# Patient Record
Sex: Female | Born: 1950
Health system: Southern US, Community
[De-identification: ages and names within clinical notes are randomized; demographics above are authoritative.]

## PROBLEM LIST (undated history)

## (undated) DIAGNOSIS — IMO0002 Reserved for concepts with insufficient information to code with codable children: Secondary | ICD-10-CM

## (undated) DIAGNOSIS — R011 Cardiac murmur, unspecified: Secondary | ICD-10-CM

## (undated) DIAGNOSIS — I10 Essential (primary) hypertension: Secondary | ICD-10-CM

## (undated) DIAGNOSIS — Z9889 Other specified postprocedural states: Secondary | ICD-10-CM

## (undated) HISTORY — PX: CHOLECYSTECTOMY: SHX55

## (undated) HISTORY — PX: APPENDECTOMY: SHX54

## (undated) HISTORY — PX: ABDOMINAL HYSTERECTOMY: SHX81

## (undated) HISTORY — PX: TONSILLECTOMY: SUR1361

---

## 2004-06-12 ENCOUNTER — Emergency Department: Payer: Self-pay | Admitting: Emergency Medicine

## 2004-10-05 ENCOUNTER — Ambulatory Visit: Payer: Self-pay

## 2004-12-07 ENCOUNTER — Other Ambulatory Visit: Payer: Self-pay

## 2004-12-07 ENCOUNTER — Emergency Department: Payer: Self-pay | Admitting: Emergency Medicine

## 2007-07-02 ENCOUNTER — Emergency Department: Payer: Self-pay | Admitting: Emergency Medicine

## 2008-02-13 ENCOUNTER — Emergency Department: Payer: Self-pay | Admitting: Emergency Medicine

## 2010-02-18 ENCOUNTER — Emergency Department: Payer: Self-pay | Admitting: Emergency Medicine

## 2010-05-22 ENCOUNTER — Emergency Department (HOSPITAL_COMMUNITY): Admission: EM | Admit: 2010-05-22 | Discharge: 2010-05-22 | Payer: Self-pay | Admitting: Emergency Medicine

## 2010-09-20 LAB — COMPREHENSIVE METABOLIC PANEL
AST: 18 U/L (ref 0–37)
Albumin: 3.7 g/dL (ref 3.5–5.2)
BUN: 21 mg/dL (ref 6–23)
Chloride: 110 mEq/L (ref 96–112)
Creatinine, Ser: 1.01 mg/dL (ref 0.4–1.2)
GFR calc Af Amer: >60 mL/min (ref >60)
Total Protein: 7.2 g/dL (ref 6.0–8.3)

## 2010-09-20 LAB — URINALYSIS, ROUTINE W REFLEX MICROSCOPIC
Ketones, ur: NEGATIVE mg/dL
Protein, ur: NEGATIVE mg/dL
Urobilinogen, UA: 0.2 mg/dL (ref 0.0–1.0)

## 2010-09-20 LAB — CBC
MCH: 28.8 pg (ref 26.0–34.0)
MCV: 88.1 fL (ref 78.0–100.0)
Platelets: 242 10*3/uL (ref 150–400)
RBC: 4.2 MIL/uL (ref 3.87–5.11)
RDW: 12.9 % (ref 11.5–15.5)
WBC: 7 10*3/uL (ref 4.0–10.5)

## 2010-09-20 LAB — URINE MICROSCOPIC-ADD ON

## 2010-09-20 LAB — DIFFERENTIAL
Eosinophils Relative: 2 % (ref 0–5)
Lymphocytes Relative: 40 % (ref 12–46)
Lymphs Abs: 2.8 10*3/uL (ref 0.7–4.0)
Monocytes Absolute: 0.3 10*3/uL (ref 0.1–1.0)
Monocytes Relative: 4 % (ref 3–12)
Neutro Abs: 3.8 10*3/uL (ref 1.7–7.7)

## 2010-09-20 LAB — WET PREP, GENITAL
Trich, Wet Prep: NONE SEEN
Yeast Wet Prep HPF POC: NONE SEEN

## 2010-09-20 LAB — GC/CHLAMYDIA PROBE AMP, GENITAL: GC Probe Amp, Genital: NEGATIVE

## 2011-04-26 ENCOUNTER — Ambulatory Visit: Payer: Self-pay | Admitting: Internal Medicine

## 2011-06-25 ENCOUNTER — Emergency Department: Payer: Self-pay | Admitting: Emergency Medicine

## 2012-01-11 ENCOUNTER — Emergency Department: Payer: Self-pay | Admitting: *Deleted

## 2012-09-05 ENCOUNTER — Emergency Department: Payer: Self-pay | Admitting: Emergency Medicine

## 2012-09-05 LAB — CBC
HCT: 37.3 % (ref 35.0–47.0)
HGB: 12 g/dL (ref 12.0–16.0)
MCH: 28 pg (ref 26.0–34.0)
MCHC: 32.2 g/dL (ref 32.0–36.0)
RDW: 13.5 % (ref 11.5–14.5)

## 2012-09-05 LAB — COMPREHENSIVE METABOLIC PANEL
Albumin: 3.7 g/dL (ref 3.4–5.0)
Anion Gap: 6 — ABNORMAL LOW (ref 7–16)
Bilirubin,Total: 0.2 mg/dL (ref 0.2–1.0)
Co2: 28 mmol/L (ref 21–32)
Osmolality: 279 (ref 275–301)
Potassium: 3.3 mmol/L — ABNORMAL LOW (ref 3.5–5.1)

## 2012-09-05 LAB — URINALYSIS, COMPLETE
Ph: 6 (ref 4.5–8.0)
Protein: NEGATIVE
RBC,UR: 1 /HPF (ref 0–5)
Specific Gravity: 1.008 (ref 1.003–1.030)
Squamous Epithelial: 2

## 2012-09-05 LAB — LIPASE, BLOOD: Lipase: 208 U/L (ref 73–393)

## 2013-09-02 ENCOUNTER — Emergency Department (HOSPITAL_COMMUNITY)
Admission: EM | Admit: 2013-09-02 | Discharge: 2013-09-02 | Disposition: A | Discharge status: Home / Self Care | Payer: BC Managed Care – PPO | Attending: Emergency Medicine | Admitting: Emergency Medicine

## 2013-09-02 ENCOUNTER — Encounter (HOSPITAL_COMMUNITY): Payer: Self-pay | Admitting: Emergency Medicine

## 2013-09-02 DIAGNOSIS — R599 Enlarged lymph nodes, unspecified: Secondary | ICD-10-CM | POA: Insufficient documentation

## 2013-09-02 DIAGNOSIS — R42 Dizziness and giddiness: Secondary | ICD-10-CM | POA: Insufficient documentation

## 2013-09-02 DIAGNOSIS — J329 Chronic sinusitis, unspecified: Secondary | ICD-10-CM | POA: Insufficient documentation

## 2013-09-02 MED ORDER — PSEUDOEPHEDRINE HCL 30 MG PO TABS: 30.0000 mg | ORAL_TABLET | Freq: Four times a day (QID) | ORAL | Status: DC | PRN

## 2013-09-02 MED ORDER — AMOXICILLIN 500 MG PO CAPS: 500.0000 mg | ORAL_CAPSULE | Freq: Three times a day (TID) | ORAL | Status: DC

## 2013-09-02 NOTE — ED Notes (Signed)
Pt states she has nasal congestion x 2 days, c/o headache at times. Pt states she tries mucinex, tylenol sinus w/o relief.

## 2013-09-02 NOTE — Discharge Instructions (Signed)
Sinus Headache °A sinus headache happens when your sinuses become clogged or puffy (swollen). Sinus headaches can be mild or severe. °HOME CARE °· Take your medicines (antibiotics) as told. Finish them even if you start to feel better. °· Only take medicine as told by your doctor. °· Use a nose spray if you feel stuffed up (congested). °GET HELP RIGHT AWAY IF: °· You have a fever. °· You have trouble seeing. °· You suddenly have pain in your face or head. °· You start to twitch or shake (seizure). °· You are confused. °· You get headaches more than once a week. °· Light or sound bothers you. °· You feel sick to your stomach (nauseous) or throw up (vomit). °· Your headaches do not get better with treatment. °MAKE SURE YOU: °· Understand these instructions. °· Will watch your condition. °· Will get help right away if you are not doing well or get worse. °Document Released: 10/26/2010 Document Revised: 09/18/2011 Document Reviewed: 10/26/2010 °ExitCare® Patient Information ©2014 [employer]. ° °Sinusitis °Sinusitis is redness, soreness, and swelling (inflammation) of the paranasal sinuses. Paranasal sinuses are air pockets within the bones of your face (beneath the eyes, the middle of the forehead, or above the eyes). In healthy paranasal sinuses, mucus is able to drain out, and air is able to circulate through them by way of your nose. However, when your paranasal sinuses are inflamed, mucus and air can become trapped. This can allow bacteria and other germs to grow and cause infection. °Sinusitis can develop quickly and last only a short time (acute) or continue over a long period (chronic). Sinusitis that lasts for more than 12 weeks is considered chronic.  °CAUSES  °Causes of sinusitis include: °· Allergies. °· Structural abnormalities, such as displacement of the cartilage that separates your nostrils (deviated septum), which can decrease the air flow through your nose and sinuses and affect sinus  drainage. °· Functional abnormalities, such as when the small hairs (cilia) that line your sinuses and help remove mucus do not work properly or are not present. °SYMPTOMS  °Symptoms of acute and chronic sinusitis are the same. The primary symptoms are pain and pressure around the affected sinuses. Other symptoms include: °· Upper toothache. °· Earache. °· Headache. °· Bad breath. °· Decreased sense of smell and taste. °· A cough, which worsens when you are lying flat. °· Fatigue. °· Fever. °· Thick drainage from your nose, which often is green and may contain pus (purulent). °· Swelling and warmth over the affected sinuses. °DIAGNOSIS  °Your caregiver will perform a physical exam. During the exam, your caregiver may: °· Look in your nose for signs of abnormal growths in your nostrils (nasal polyps). °· Tap over the affected sinus to check for signs of infection. °· View the inside of your sinuses (endoscopy) with a special imaging device with a light attached (endoscope), which is inserted into your sinuses. °If your caregiver suspects that you have chronic sinusitis, one or more of the following tests may be recommended: °· Allergy tests. °· Nasal culture A sample of mucus is taken from your nose and sent to a lab and screened for bacteria. °· Nasal cytology A sample of mucus is taken from your nose and examined by your caregiver to determine if your sinusitis is related to an allergy. °TREATMENT  °Most cases of acute sinusitis are related to a viral infection and will resolve on their own within 10 days. Sometimes medicines are prescribed to help relieve symptoms (pain medicine,   decongestants, nasal steroid sprays, or saline sprays).  °However, for sinusitis related to a bacterial infection, your caregiver will prescribe antibiotic medicines. These are medicines that will help kill the bacteria causing the infection.  °Rarely, sinusitis is caused by a fungal infection. In theses cases, your caregiver will  prescribe antifungal medicine. °For some cases of chronic sinusitis, surgery is needed. Generally, these are cases in which sinusitis recurs more than 3 times per year, despite other treatments. °HOME CARE INSTRUCTIONS  °· Drink plenty of water. Water helps thin the mucus so your sinuses can drain more easily. °· Use a humidifier. °· Inhale steam 3 to 4 times a day (for example, sit in the bathroom with the shower running). °· Apply a warm, moist washcloth to your face 3 to 4 times a day, or as directed by your caregiver. °· Use saline nasal sprays to help moisten and clean your sinuses. °· Take over-the-counter or prescription medicines for pain, discomfort, or fever only as directed by your caregiver. °SEEK IMMEDIATE MEDICAL CARE IF: °· You have increasing pain or severe headaches. °· You have nausea, vomiting, or drowsiness. °· You have swelling around your face. °· You have vision problems. °· You have a stiff neck. °· You have difficulty breathing. °MAKE SURE YOU:  °· Understand these instructions. °· Will watch your condition. °· Will get help right away if you are not doing well or get worse. °Document Released: 06/26/2005 Document Revised: 09/18/2011 Document Reviewed: 07/11/2011 °ExitCare® Patient Information ©2014 [employer]. ° °

## 2013-09-02 NOTE — ED Provider Notes (Signed)
CSN: [id number]     Arrival date & time 09/02/13  1430 History  This chart was scribed for non-physician practitioner, Roxy Horsemanobert Argelia Formisano, PA-C working with Audree CamelScott T Goldston, MD by Greggory StallionKayla Andersen, ED scribe. This patient was seen in room WTR8/WTR8 and the patient's care was started at 3:48 PM.    Chief Complaint  Patient presents with  . Facial Pain  . Nasal Congestion   The history is provided by the patient. No language interpreter was used.   HPI Comments: Lori Holland is a 63 y.o. female who presents to the Emergency Department complaining of sudden onset maxillary sinus pain, pressure and nasal congestion that started 2 days ago. She states she has also had a cough, headache and intermittent light headedness. Pt has tried mucinex, alka seltzer plus and tylenol sinus with no relief. Denies fever.   History reviewed. No pertinent past medical history. Past Surgical History  Procedure Laterality Date  . Abdominal hysterectomy    . Tonsillectomy     No family history on file. History  Substance Use Topics  . Smoking status: Never Smoker   . Smokeless tobacco: Not on file  . Alcohol Use: No   OB History   Grav Para Term Preterm Abortions TAB SAB Ect Mult Living                 Review of Systems  Constitutional: Negative for fever.  HENT: Positive for congestion and sinus pressure.   Eyes: Negative for redness.  Respiratory: Positive for cough. Negative for shortness of breath.   Gastrointestinal: Negative for abdominal pain.  Musculoskeletal: Negative for gait problem.  Skin: Negative for rash.  Neurological: Positive for light-headedness and headaches. Negative for speech difficulty.  Psychiatric/Behavioral: Negative for confusion.   Allergies  Sulfa antibiotics  Home Medications   Current Outpatient Rx  Name  Route  Sig  Dispense  Refill  . aspirin-sod bicarb-citric acid (ALKA-SELTZER) 325 MG TBEF tablet   Oral   Take 325 mg by mouth every 6 (six) hours as needed.         . Diphenhydramine-PSE-APAP (TYLENOL ALLERGY SINUS NIGHT PO)   Oral   Take 1 capsule by mouth at bedtime as needed (cold symtoms).         Marland Kitchen. guaiFENesin (MUCINEX) 600 MG 12 hr tablet   Oral   Take 600 mg by mouth 2 (two) times daily as needed (cough).          BP 147/68  Pulse 64  Temp(Src) 97.9 F (36.6 C) (Oral)  Resp 20  SpO2 99%  Physical Exam  Nursing note and vitals reviewed. Constitutional: She is oriented to person, place, and time. She appears well-developed and well-nourished. No distress.  HENT:  Head: Normocephalic and atraumatic.  Nose: Right sinus exhibits maxillary sinus tenderness and frontal sinus tenderness. Left sinus exhibits maxillary sinus tenderness and frontal sinus tenderness.  Congestion behind TMs.   Eyes: Conjunctivae and EOM are normal.  Cardiovascular: Normal rate, regular rhythm and normal heart sounds.  Exam reveals no gallop and no friction rub.   No murmur heard. Pulmonary/Chest: Effort normal and breath sounds normal. No stridor. No respiratory distress. She has no wheezes. She has no rales.  Abdominal: She exhibits no distension.  Musculoskeletal: She exhibits no edema.  Lymphadenopathy:    She has cervical adenopathy.  Neurological: She is alert and oriented to person, place, and time. No cranial nerve deficit.  Skin: Skin is warm and dry.  Psychiatric: She has a  normal mood and affect.    ED Course  Procedures (including critical care time)  DIAGNOSTIC STUDIES: Oxygen Saturation is 99% on RA, normal by my interpretation.    COORDINATION OF CARE: 3:52 PM-Discussed treatment plan which includes amoxicillin and sudafed with pt at bedside and pt agreed to plan. Return precautions given.   Labs Review Labs Reviewed - No data to display Imaging Review No results found.  EKG Interpretation   None       MDM   Final diagnoses:  Sinusitis      I personally performed the services described in this documentation,  which was scribed in my presence. The recorded information has been reviewed and is accurate.    Roxy Horseman, PA-C 09/02/13 1605

## 2013-09-03 NOTE — ED Provider Notes (Signed)
Medical screening examination/treatment/procedure(s) were performed by non-physician practitioner and as supervising physician I was immediately available for consultation/collaboration.  EKG Interpretation   None         Madoline Bhatt T Marquite Attwood, MD 09/03/13 0001 

## 2013-09-07 ENCOUNTER — Emergency Department (HOSPITAL_COMMUNITY)
Admission: EM | Admit: 2013-09-07 | Discharge: 2013-09-07 | Disposition: A | Discharge status: Home / Self Care | Payer: BC Managed Care – PPO | Source: Home / Self Care

## 2013-09-07 ENCOUNTER — Encounter (HOSPITAL_COMMUNITY): Payer: Self-pay | Admitting: Emergency Medicine

## 2013-09-07 DIAGNOSIS — B373 Candidiasis of vulva and vagina: Secondary | ICD-10-CM

## 2013-09-07 DIAGNOSIS — B3731 Acute candidiasis of vulva and vagina: Secondary | ICD-10-CM

## 2013-09-07 LAB — POCT URINALYSIS DIP (DEVICE)
BILIRUBIN URINE: NEGATIVE
GLUCOSE, UA: NEGATIVE mg/dL
Ketones, ur: NEGATIVE mg/dL
NITRITE: NEGATIVE
Protein, ur: NEGATIVE mg/dL
Specific Gravity, Urine: >=1.03 (ref 1.005–1.030)
Urobilinogen, UA: 0.2 mg/dL (ref 0.0–1.0)
pH: 5 (ref 5.0–8.0)

## 2013-09-07 MED ORDER — ALIGN 4 MG PO CAPS: 1.0000 | ORAL_CAPSULE | Freq: Every day | ORAL | Status: DC

## 2013-09-07 MED ORDER — FLUCONAZOLE 200 MG PO TABS: 200.0000 mg | ORAL_TABLET | Freq: Every day | ORAL | Status: AC

## 2013-09-07 NOTE — ED Provider Notes (Signed)
CSN: [id number]     Arrival date & time 09/07/13  1702 History   None    Chief Complaint  Patient presents with  . Vaginitis   (Consider location/radiation/quality/duration/timing/severity/associated sxs/prior Treatment)  HPI  The patient is a 63 year old female presenting tonight with reports of vaginal itching which started yesterday. Patient states she started taking amoxicillin for a recent sinus infections past Tuesday. The patient is sexually active in a monogamous relationship.  The patient denies any other symptoms or complaints but is concerned about a vaginal yeast infection and wanted to "get ahead of it". Denies history of frequent yeast infections or diabetes.   History reviewed. No pertinent past medical history. Past Surgical History  Procedure Laterality Date  . Abdominal hysterectomy    . Tonsillectomy     No family history on file. History  Substance Use Topics  . Smoking status: Never Smoker   . Smokeless tobacco: Not on file  . Alcohol Use: No   OB History   Grav Para Term Preterm Abortions TAB SAB Ect Mult Living                 Review of Systems  Constitutional: Negative.   HENT: Negative.   Eyes: Negative.   Respiratory: Negative.   Cardiovascular: Negative.   Gastrointestinal: Negative.   Endocrine: Negative.   Genitourinary: Negative for dysuria, urgency, frequency, hematuria, flank pain, decreased urine volume, vaginal bleeding, vaginal discharge, difficulty urinating, genital sores, pelvic pain and dyspareunia.       Vaginal itching as the patient's only genital complaint.    Musculoskeletal: Negative.   Allergic/Immunologic: Negative.   Neurological: Negative.   Hematological: Negative.   Psychiatric/Behavioral: Negative.     Allergies  Sulfa antibiotics  Home Medications   Current Outpatient Rx  Name  Route  Sig  Dispense  Refill  . amoxicillin (AMOXIL) 500 MG capsule   Oral   Take 1 capsule (500 mg total) by mouth 3 (three) times  daily.   30 capsule   0   . aspirin-sod bicarb-citric acid (ALKA-SELTZER) 325 MG TBEF tablet   Oral   Take 325 mg by mouth every 6 (six) hours as needed.         . Diphenhydramine-PSE-APAP (TYLENOL ALLERGY SINUS NIGHT PO)   Oral   Take 1 capsule by mouth at bedtime as needed (cold symtoms).         . fluconazole (DIFLUCAN) 200 MG tablet   Oral   Take 1 tablet (200 mg total) by mouth daily.   3 tablet   0   . guaiFENesin (MUCINEX) 600 MG 12 hr tablet   Oral   Take 600 mg by mouth 2 (two) times daily as needed (cough).         . Probiotic Product (ALIGN) 4 MG CAPS   Oral   Take 1 capsule (4 mg total) by mouth daily.   30 capsule   2   . pseudoephedrine (NASAL DECONGESTANT) 30 MG tablet   Oral   Take 1 tablet (30 mg total) by mouth every 6 (six) hours as needed for congestion.   30 tablet   0    BP 143/66  Pulse 60  Temp(Src) 97.3 F (36.3 C) (Oral)  Resp 16  SpO2 100%  Physical Exam  Nursing note and vitals reviewed. Constitutional: She appears well-developed and well-nourished. No distress.  Cardiovascular: Normal rate, regular rhythm, normal heart sounds and intact distal pulses.  Exam reveals no gallop and no friction rub.  No murmur heard. Pulmonary/Chest: Effort normal and breath sounds normal. No respiratory distress. She has no wheezes. She has no rales. She exhibits no tenderness.  Abdominal: Soft. Bowel sounds are normal. She exhibits no distension and no mass. There is no tenderness. There is no rebound and no guarding.  Genitourinary: Vaginal discharge found.  Mild erythema noted in the patient's vestibule with thick white discharge present and vaginal opening.  Skin: Skin is warm and dry. She is not diaphoretic.    ED Course  Procedures (including critical care time) Labs Review Labs Reviewed  POCT URINALYSIS DIP (DEVICE) - Abnormal; Notable for the following:    Hgb urine dipstick TRACE (*)    Leukocytes, UA TRACE (*)    All other  components within normal limits   Imaging Review No results found.   MDM   1. Vaginal yeast infection    Meds ordered this encounter  Medications  . Probiotic Product (ALIGN) 4 MG CAPS    Sig: Take 1 capsule (4 mg total) by mouth daily.    Dispense:  30 capsule    Refill:  2  . fluconazole (DIFLUCAN) 200 MG tablet    Sig: Take 1 tablet (200 mg total) by mouth daily.    Dispense:  3 tablet    Refill:  0       Weber Cooksatherine Rossi, NP 09/07/13 1845

## 2013-09-07 NOTE — Discharge Instructions (Signed)
Use of a Probiotic with Lactobacillus in it can help prevent infections when on antibiotic therapy. Candidal Vulvovaginitis Candidal vulvovaginitis is an infection of the vagina and vulva. The vulva is the skin around the opening of the vagina. This may cause itching and discomfort in and around the vagina.  HOME CARE  Only take medicine as told by your doctor.  Do not have sex (intercourse) until the infection is healed or as told by your doctor.  Practice safe sex.  Tell your sex partner about your infection.  Do not douche or use tampons.  Wear cotton underwear. Do not wear tight pants or panty hose.  Eat yogurt. This may help treat and prevent yeast infections. GET HELP RIGHT AWAY IF:   You have a fever.  Your problems get worse during treatment or do not get better in 3 days.  You have discomfort, irritation, or itching in your vagina or vulva area.  You have pain after sex.  You start to get belly (abdominal) pain. MAKE SURE YOU:  Understand these instructions.  Will watch your condition.  Will get help right away if you are not doing well or get worse. Document Released: 09/22/2008 Document Revised: 09/18/2011 Document Reviewed: 09/22/2008 [facility] Patient Information 2014 [place]ExitCare, [place].

## 2013-09-07 NOTE — ED Notes (Signed)
Pt reports she started an antibiotic last week and now has a yeast infection. Patient denies any pain just vaginal itching. Patient is alert and oriented and in no acute distress.

## 2013-09-08 NOTE — ED Provider Notes (Signed)
Medical screening examination/treatment/procedure(s) were performed by a resident physician or non-physician practitioner and as the supervising physician I was immediately available for consultation/collaboration.  Connor Foxworthy, MD    Trinia Georgi S Luisana Lutzke, MD 09/08/13 0748 

## 2014-01-11 ENCOUNTER — Encounter (HOSPITAL_COMMUNITY): Payer: Self-pay | Admitting: Emergency Medicine

## 2014-01-11 ENCOUNTER — Emergency Department (HOSPITAL_COMMUNITY)
Admission: EM | Admit: 2014-01-11 | Discharge: 2014-01-11 | Disposition: A | Discharge status: Home / Self Care | Payer: No Typology Code available for payment source | Attending: Emergency Medicine | Admitting: Emergency Medicine

## 2014-01-11 DIAGNOSIS — M545 Low back pain, unspecified: Secondary | ICD-10-CM | POA: Insufficient documentation

## 2014-01-11 HISTORY — DX: Reserved for concepts with insufficient information to code with codable children: IMO0002

## 2014-01-11 MED ORDER — METHOCARBAMOL 500 MG PO TABS: 500.0000 mg | ORAL_TABLET | Freq: Two times a day (BID) | ORAL | Status: DC

## 2014-01-11 MED ORDER — TRAMADOL HCL 50 MG PO TABS: 50.0000 mg | ORAL_TABLET | Freq: Four times a day (QID) | ORAL | Status: DC | PRN

## 2014-01-11 MED ORDER — KETOROLAC TROMETHAMINE 60 MG/2ML IM SOLN
60.0000 mg | Freq: Once | INTRAMUSCULAR | Status: AC
  Administered 2014-01-11: 60 mg via INTRAMUSCULAR
  Filled 2014-01-11: qty 2

## 2014-01-11 NOTE — ED Provider Notes (Signed)
Medical screening examination/treatment/procedure(s) were performed by non-physician practitioner and as supervising physician I was immediately available for consultation/collaboration.   Jaretzi Droz T Cammy Sanjurjo, MD 01/11/14 0617 

## 2014-01-11 NOTE — ED Notes (Signed)
Pt arrived to the ED with a complaint of lower back pain.  Pt has a hx of bulging discs and has recently started anew job.  Pt believes new job has irritated her back due to the pain coinciding with the new activity level.

## 2014-01-11 NOTE — ED Provider Notes (Signed)
CSN: [id number]     Arrival date & time 01/11/14  0302 History   First MD Initiated Contact with Patient 01/11/14 [phone]     Chief Complaint  Patient presents with  . Back Pain     (Consider location/radiation/quality/duration/timing/severity/associated sxs/prior Treatment) HPI Comments: Patient is a 63 year old female with a history of bulging discs in her low back who presents to the emergency department for low back pain. Patient states that back pain began shortly after starting a new job. She states back pain improved with rest and naproxen last weekend, but worsened again when she went back to work. Patient has had no relief from naproxen over the last 2 days. She states that pain is an aching, throbbing pain that is nonradiating. Patient does endorse frequent bending and twisting of her back while at her job. She denies associated fever, urinary symptoms, abdominal pain, nausea or vomiting, numbness/paresthesias, extremity weakness, inability to walk, and bowel or bladder incontinence.  Patient is a 63 y.o. female presenting with back pain. The history is provided by the patient. No language interpreter was used.  Back Pain   Past Medical History  Diagnosis Date  . Bulging disc    Past Surgical History  Procedure Laterality Date  . Abdominal hysterectomy    . Tonsillectomy     History reviewed. No pertinent family history. History  Substance Use Topics  . Smoking status: Never Smoker   . Smokeless tobacco: Not on file  . Alcohol Use: No   OB History   Grav Para Term Preterm Abortions TAB SAB Ect Mult Living                  Review of Systems  Musculoskeletal: Positive for back pain.  All other systems reviewed and are negative.    Allergies  Sulfa antibiotics  Home Medications   Prior to Admission medications   Medication Sig Start Date End Date Taking? Authorizing Provider  methocarbamol (ROBAXIN) 500 MG tablet Take 1 tablet (500 mg total) by mouth 2 (two)  times daily. 01/11/14   Antony MaduraKelly Kayline Sheer, PA-C  traMADol (ULTRAM) 50 MG tablet Take 1 tablet (50 mg total) by mouth every 6 (six) hours as needed. 01/11/14   Antony MaduraKelly Shakirah Kirkey, PA-C   BP 142/74  Pulse 58  Temp(Src) 98 F (36.7 C) (Oral)  Resp 15  Ht 5\' 7"  (1.702 m)  Wt 137 lb (62.143 kg)  BMI 21.45 kg/m2  SpO2 100% Physical Exam  Nursing note and vitals reviewed. Constitutional: She is oriented to person, place, and time. She appears well-developed and well-nourished. No distress.  Nontoxic/nonseptic appearing  HENT:  Head: Normocephalic and atraumatic.  Eyes: Conjunctivae and EOM are normal. No scleral icterus.  Neck: Normal range of motion.  Cardiovascular: Normal rate, regular rhythm and intact distal pulses.   DP and PT pulses 2+ bilaterally  Pulmonary/Chest: Effort normal. No respiratory distress.  Musculoskeletal: Normal range of motion. She exhibits tenderness.  Tenderness to palpation of lower lumbar paraspinal muscles bilaterally. No bony deformities, step-offs, or crepitus. No evidence of direct injury or trauma.  Neurological: She is alert and oriented to person, place, and time. She exhibits normal muscle tone. Coordination normal.  GCS 15. Patient moves extremities without ataxia. No gross sensory deficits appreciated. Patient ambulates with normal gait.  Skin: Skin is warm and dry. No rash noted. She is not diaphoretic. No erythema. No pallor.  Psychiatric: She has a normal mood and affect. Her behavior is normal.    ED Course  Procedures (including critical care time) Labs Review Labs Reviewed - No data to display  Imaging Review No results found.   EKG Interpretation None      MDM   Final diagnoses:  Low back pain without sciatica, unspecified back pain laterality    Patient with back pain. She endorses a hx of bulging discs and states pain today feels the same. No neurological deficits and normal neuro exam. Patient can ambulate without difficulty. No loss of bowel  or bladder control. No concern for cauda equina. No fever, night sweats, or h/o cancer. No IVDU. RICE protocol and pain medicine indicated and discussed with patient. Pain treated in ED with Toradol as patient drove herself to the emergency department today. Advised primary care followup for symptom management. Return precautions provided and patient agreeable to plan with no unaddressed concerns.   Filed Vitals:   01/11/14 0332  BP: 142/74  Pulse: 58  Temp: 98 F (36.7 C)  TempSrc: Oral  Resp: 15  Height: 5\' 7"  (1.702 m)  Weight: 137 lb (62.143 kg)  SpO2: 100%      Antony MaduraKelly Estrella Alcaraz, PA-C 01/11/14 [phone]

## 2014-01-11 NOTE — Discharge Instructions (Signed)
Recommend you refrain from frequent bending and twisting as well as strenuous activity or heavy lifting. Continue taking naproxen as prescribed for back pain. You may take tramadol for severe pain and Robaxin for muscle spasms. Followup with your primary care doctor to ensure symptoms resolve. Return as needed if symptoms worsen.   Back Pain, Adult Low back pain is very common. About 1 in 5 people have back pain.The cause of low back pain is rarely dangerous. The pain often gets better over time.About half of people with a sudden onset of back pain feel better in just 2 weeks. About 8 in 10 people feel better by 6 weeks.  CAUSES Some common causes of back pain include:  Strain of the muscles or ligaments supporting the spine.  Wear and tear (degeneration) of the spinal discs.  Arthritis.  Direct injury to the back. DIAGNOSIS Most of the time, the direct cause of low back pain is not known.However, back pain can be treated effectively even when the exact cause of the pain is unknown.Answering your caregiver's questions about your overall health and symptoms is one of the most accurate ways to make sure the cause of your pain is not dangerous. If your caregiver needs more information, he or she may order lab work or imaging tests (X-rays or MRIs).However, even if imaging tests show changes in your back, this usually does not require surgery. HOME CARE INSTRUCTIONS For many people, back pain returns.Since low back pain is rarely dangerous, it is often a condition that people can learn to [facility]ageon their own.   Remain active. It is stressful on the back to sit or stand in one place. Do not sit, drive, or stand in one place for more than 30 minutes at a time. Take short walks on level surfaces as soon as pain allows.Try to increase the length of time you walk each day.  Do not stay in bed.Resting more than 1 or 2 days can delay your recovery.  Do not avoid exercise or work.Your body is  made to move.It is not dangerous to be active, even though your back may hurt.Your back will likely heal faster if you return to being active before your pain is gone.  Pay attention to your body when you bend and lift. Many people have less discomfortwhen lifting if they bend their knees, keep the load close to their bodies,and avoid twisting. Often, the most comfortable positions are those that put less stress on your recovering back.  Find a comfortable position to sleep. Use a firm mattress and lie on your side with your knees slightly bent. If you lie on your back, put a pillow under your knees.  Only take over-the-counter or prescription medicines as directed by your caregiver. Over-the-counter medicines to reduce pain and inflammation are often the most helpful.Your caregiver may prescribe muscle relaxant drugs.These medicines help dull your pain so you can more quickly return to your normal activities and healthy exercise.  Put ice on the injured area.  Put ice in a plastic bag.  Place a towel between your skin and the bag.  Leave the ice on for 15-20 minutes, 03-04 times a day for the first 2 to 3 days. After that, ice and heat may be alternated to reduce pain and spasms.  Ask your caregiver about trying back exercises and gentle massage. This may be of some benefit.  Avoid feeling anxious or stressed.Stress increases muscle tension and can worsen back pain.It is important to recognize when you  are anxious or stressed and learn ways to manage it.Exercise is a great option. SEEK MEDICAL CARE IF:  You have pain that is not relieved with rest or medicine.  You have pain that does not improve in 1 week.  You have new symptoms.  You are generally not feeling well. SEEK IMMEDIATE MEDICAL CARE IF:   You have pain that radiates from your back into your legs.  You develop new bowel or bladder control problems.  You have unusual weakness or numbness in your arms or  legs.  You develop nausea or vomiting.  You develop abdominal pain.  You feel faint. Document Released: 06/26/2005 Document Revised: 12/26/2011 Document Reviewed: 11/14/2010 [facility] Patient Information 2015 [place], [place]. This information is not intended to replace advice given to you by your health care provider. Make sure you discuss any questions you have with your health care provider.

## 2014-06-06 ENCOUNTER — Emergency Department: Payer: Self-pay | Admitting: Student

## 2014-07-28 ENCOUNTER — Emergency Department: Payer: Self-pay | Admitting: Emergency Medicine

## 2014-07-28 LAB — CBC WITH DIFFERENTIAL/PLATELET
BASOS ABS: 0.1 10*3/uL (ref 0.0–0.1)
Basophil %: 0.9 %
EOS ABS: 0.2 10*3/uL (ref 0.0–0.7)
Eosinophil %: 3.2 %
HCT: 39.4 % (ref 35.0–47.0)
HGB: 12.7 g/dL (ref 12.0–16.0)
LYMPHS ABS: 2.1 10*3/uL (ref 1.0–3.6)
LYMPHS PCT: 30.1 %
MCH: 28.5 pg (ref 26.0–34.0)
MCHC: 32.2 g/dL (ref 32.0–36.0)
MCV: 89 fL (ref 80–100)
MONO ABS: 0.4 x10 3/mm (ref 0.2–0.9)
Monocyte %: 6.1 %
NEUTROS ABS: 4.2 10*3/uL (ref 1.4–6.5)
Neutrophil %: 59.7 %
Platelet: 216 10*3/uL (ref 150–440)
RBC: 4.45 10*6/uL (ref 3.80–5.20)
RDW: 14 % (ref 11.5–14.5)
WBC: 7 10*3/uL (ref 3.6–11.0)

## 2014-07-28 LAB — COMPREHENSIVE METABOLIC PANEL
ALK PHOS: 78 U/L
ANION GAP: 7 (ref 7–16)
AST: 30 U/L (ref 15–37)
Albumin: 3.8 g/dL (ref 3.4–5.0)
BUN: 11 mg/dL (ref 7–18)
Bilirubin,Total: 0.2 mg/dL (ref 0.2–1.0)
CALCIUM: 9.2 mg/dL (ref 8.5–10.1)
CO2: 27 mmol/L (ref 21–32)
Chloride: 109 mmol/L — ABNORMAL HIGH (ref 98–107)
Creatinine: 0.98 mg/dL (ref 0.60–1.30)
EGFR (African American): >60
Glucose: 89 mg/dL (ref 65–99)
OSMOLALITY: 284 (ref 275–301)
POTASSIUM: 3.8 mmol/L (ref 3.5–5.1)
SGPT (ALT): 25 U/L
SODIUM: 143 mmol/L (ref 136–145)
Total Protein: 8 g/dL (ref 6.4–8.2)

## 2014-07-28 LAB — URINALYSIS, COMPLETE
BILIRUBIN, UR: NEGATIVE
BLOOD: NEGATIVE
Glucose,UR: NEGATIVE mg/dL (ref 0–75)
KETONE: NEGATIVE
Nitrite: NEGATIVE
PH: 5 (ref 4.5–8.0)
Protein: NEGATIVE
RBC, UR: NONE SEEN /HPF (ref 0–5)
Specific Gravity: 1.018 (ref 1.003–1.030)
WBC UR: 4 /HPF (ref 0–5)

## 2014-07-28 LAB — LIPASE, BLOOD: Lipase: 223 U/L (ref 73–393)

## 2014-07-30 LAB — URINE CULTURE

## 2014-08-05 ENCOUNTER — Ambulatory Visit: Payer: Self-pay | Admitting: Surgery

## 2014-08-06 ENCOUNTER — Ambulatory Visit: Payer: Self-pay | Admitting: Surgery

## 2014-08-06 LAB — PLATELET COUNT: PLATELETS: 220 10*3/uL (ref 150–440)

## 2014-11-02 LAB — SURGICAL PATHOLOGY

## 2014-11-08 NOTE — Op Note (Signed)
PATIENT NAME:  Lori BaileyINNIX, Allison F MR#:  [id number] DATE OF BIRTH:  1951/06/02  DATE OF PROCEDURE:  08/06/2014  PREOPERATIVE DIAGNOSIS: Symptomatic cholelithiasis, biliary colic.   POSTOPERATIVE DIAGNOSIS: Symptomatic cholelithiasis, biliary colic.   PROCEDURE PERFORMED: Laparoscopic cholecystectomy.   SURGEON: Natale LayMark Anabelle Bungert, MD.   ASSISTANT: Scrub tech x 2.   TYPE OF ANESTHESIA: General endotracheal.   FINDINGS: Gallbladder with stones.   SPECIMENS: Gallbladder with stones.   ESTIMATED BLOOD LOSS: Minimal.   DRAINS: None.   COUNTS:  Lap  and needle count correct x.   DESCRIPTION OF PROCEDURE: With informed consent obtained from the patient she was brought to the operating room and positioned supine. General oroendotracheal anesthesia was induced without difficulty. The patient's abdomen was widely clipped of hair, sterilely prepped and draped with ChloraPrep solution, and a timeout was observed.  A 12 mm blunt Hassan trocar was placed under direct visualization through an open technique through a transversely oriented skin incision with stay sutures being passed through the fascia and pneumoperitoneum was established. Limited laparoscopic evaluation of the abdomen demonstrated normal-appearing liver and stomach. No evidence of bowel injury upon insertion of the trocar site. Remaining trocars were then placed with a 5 mm epigastric port, two 5 mm first assistant ports in the right subcostal margin.   The gallbladder was then placed on tension above the right lobe of the liver with the patient in reverse Trendelenburg and tiled right side up. Lateral traction was placed on the infundibulum. The hepatoduodenal ligament was then dissected with a blunt technique and minimal energy liberating a cystic duct and single cystic artery with a critical view of safety cholecystectomy being achieved. Three clips were then placed on the portal side of the cystic duct, 1 on the gallbladder side and the  structure was then divided with sharp scissors. Cystic artery was divided between 2 hemoclips proximally, 1 hemoclip on the distal side, and sharp scissors. Further dissection demonstrated no evidence of a posterior cystic artery branch or aberrant bile duct. The gallbladder was then retrieved off the gallbladder fossa utilizing hook cautery apparatus and placed into an Endo Catch device and retrieved. During the retrieval process a 5 mm surgical telescope was introduced in the epigastric region which demonstrated no evidence of bowel injury. Pneumoperitoneum was then re-established.   The right upper quadrant was irrigated with 200 mL of warm normal saline and aspirated dry. Hemostasis on the operative field appeared to be excellent. Ports were then removed under direct visualization. The infraumbilical fascial defect being reapproximated with an additional figure-of-eight number 0 Vicryl suture in vertical orientation. The existing stay sutures tied to each other. A total of 25 mL of 0.25% plain Marcaine was infiltrated along all skin and fascial incisions prior to closure. 4-0 Vicryl subcuticular was applied to all skin edges followed by benzoin, Steri-Strips, Telfa, and Tegaderm. The patient was then subsequently extubated and taken to the recovery room in stable and satisfactory condition by anesthesia services.    ____________________________ Redge GainerMark A. Egbert GaribaldiBird, MD mab:bu D: 08/06/2014 14:12:01 ET T: 08/06/2014 17:53:28 ET JOB#: [id number]  cc: Loraine LericheMark A. Egbert GaribaldiBird, MD, <Dictator> Thomos Domine A Jarmel Linhardt MD ELECTRONICALLY SIGNED 08/09/2014 20:55

## 2016-01-05 ENCOUNTER — Emergency Department
Admission: EM | Admit: 2016-01-05 | Discharge: 2016-01-05 | Disposition: A | Discharge status: Home / Self Care | Payer: PPO | Attending: Emergency Medicine | Admitting: Emergency Medicine

## 2016-01-05 DIAGNOSIS — R002 Palpitations: Secondary | ICD-10-CM | POA: Diagnosis present

## 2016-01-05 DIAGNOSIS — Z79899 Other long term (current) drug therapy: Secondary | ICD-10-CM | POA: Insufficient documentation

## 2016-01-05 HISTORY — DX: Cardiac murmur, unspecified: R01.1

## 2016-01-05 LAB — BASIC METABOLIC PANEL
ANION GAP: 7 (ref 5–15)
BUN: 20 mg/dL (ref 6–20)
CHLORIDE: 108 mmol/L (ref 101–111)
CO2: 27 mmol/L (ref 22–32)
Calcium: 9.7 mg/dL (ref 8.9–10.3)
Creatinine, Ser: 1.07 mg/dL — ABNORMAL HIGH (ref 0.44–1.00)
GFR, EST NON AFRICAN AMERICAN: 53 mL/min — AB (ref >60)
Glucose, Bld: 98 mg/dL (ref 65–99)
POTASSIUM: 4.1 mmol/L (ref 3.5–5.1)
SODIUM: 142 mmol/L (ref 135–145)

## 2016-01-05 LAB — CBC
HEMATOCRIT: 35.6 % (ref 35.0–47.0)
Hemoglobin: 12.1 g/dL (ref 12.0–16.0)
MCH: 29.4 pg (ref 26.0–34.0)
MCHC: 34.1 g/dL (ref 32.0–36.0)
MCV: 86.3 fL (ref 80.0–100.0)
Platelets: 203 10*3/uL (ref 150–440)
RBC: 4.12 MIL/uL (ref 3.80–5.20)
RDW: 14.6 % — AB (ref 11.5–14.5)
WBC: 7.1 10*3/uL (ref 3.6–11.0)

## 2016-01-05 LAB — TROPONIN I: Troponin I: <0.03 ng/mL (ref <0.03)

## 2016-01-05 MED ORDER — TRAZODONE HCL 50 MG PO TABS: 50.0000 mg | ORAL_TABLET | Freq: Every day | ORAL | Status: DC

## 2016-01-05 NOTE — ED Provider Notes (Signed)
[facility] Provider [facility]  Time seen: 6:07 PM  I have reviewed the triage vital signs and the nursing notes.   HISTORY  Chief Complaint Palpitations and Headache    HPI Lori Holland is a 65 y.o. female with no past medical history who presents to the emergency department with heart palpitations. According to the patient for the past one week she has been having intermittent heart palpitations along with a dizziness sensation. Patient states the palpitations are worse at night. She also states they typically occur with a mild headache. Denies any chest pain or trouble breathing at any point. Denies any nausea. Patient does state since her sister was killed several months ago she has been getting very little sleep 3-4 hours per night. States significant caffeine use, denies alcohol use.     Past Medical History  Diagnosis Date  . Bulging disc   . Heart murmur     There are no active problems to display for this patient.   Past Surgical History  Procedure Laterality Date  . Abdominal hysterectomy    . Tonsillectomy      Current Outpatient Rx  Name  Route  Sig  Dispense  Refill  . methocarbamol (ROBAXIN) 500 MG tablet   Oral   Take 1 tablet (500 mg total) by mouth 2 (two) times daily.   20 tablet   0   . traMADol (ULTRAM) 50 MG tablet   Oral   Take 1 tablet (50 mg total) by mouth every 6 (six) hours as needed.   15 tablet   0     Allergies Amoxicillin; Iodine; and Sulfa antibiotics  No family history on file.  Social History Social History  Substance Use Topics  . Smoking status: Never Smoker   . Smokeless tobacco: None  . Alcohol Use: No    Review of Systems Constitutional: Negative for fever. Cardiovascular: Negative for chest pain.Positive for palpitations. Respiratory: Negative for shortness of breath. Gastrointestinal: Negative for abdominal pain Neurological: Negative for headache 10-point ROS  otherwise negative.  ____________________________________________   PHYSICAL EXAM:  VITAL SIGNS: ED Triage Vitals  Enc Vitals Group     BP 01/05/16 1736 97/72 mmHg     Pulse Rate 01/05/16 1736 66     Resp 01/05/16 1736 18     Temp 01/05/16 1736 97.9 F (36.6 C)     Temp Source 01/05/16 1736 Oral     SpO2 01/05/16 1736 100 %     Weight 01/05/16 1736 135 lb (61.236 kg)     Height --      Head Cir --      Peak Flow --      Pain Score 01/05/16 1731 5     Pain Loc --      Pain Edu? --      Excl. in GC? --    Constitutional: Alert and oriented. Well appearing and in no distress. Eyes: Normal exam ENT   Head: Normocephalic and atraumatic.   Mouth/Throat: Mucous membranes are moist. Cardiovascular: Normal rate, regular rhythm. No murmur Respiratory: Normal respiratory effort without tachypnea nor retractions. Breath sounds are clear  Gastrointestinal: Soft and nontender. No distention.   Musculoskeletal: Nontender with normal range of motion in all extremities.  Neurologic:  Normal speech and language. No gross focal neurologic deficits  Skin:  Skin is warm, dry and intact.  Psychiatric: Mood and affect are normal. Speech and behavior are normal.   ____________________________________________    EKG  EKG reviewed and interpreted by myself shows normal sinus rhythm at 66 bpm, narrow QRS, normal axis, normal intervals, no ST changes. Normal EKG.  ____________________________________________   INITIAL IMPRESSION / ASSESSMENT AND PLAN / ED COURSE  Pertinent labs & imaging results that were available during my care of the patient were reviewed by me and considered in my medical decision making (see chart for details).  The patient presents the emergency department with intermittent palpitations over the past one week. Patient denies any symptoms currently. States when she does get the palpitations she feels somewhat dizzy, but denies any near-syncopal episodes. Denies  any chest pain or shortness of breath. Patient has a normal physical exam with a very reassuring EKG. We will check labs. If labs are within normal limits we will discharge with cardiology follow-up for consideration of Holter monitor testing. I discussed with the patient she needs to increase her sleep, and if she is amenable we will provide a sleep aid for the patient. I also discussed limiting her caffeine intake and drinking plenty of non-caffeinated beverages. Patient agreeable to this plan.  Patient's labs are normal. We will discharge home cardiology follow-up. Patient agreeable to plan.  ____________________________________________   FINAL CLINICAL IMPRESSION(S) / ED DIAGNOSES  Palpitations   Minna AntisKevin Matthewjames Petrasek, MD 01/05/16 [phone]

## 2016-01-05 NOTE — Discharge Instructions (Signed)
Palpitations  A palpitation is the feeling that your heartbeat is irregular or is faster than normal. It may feel like your heart is fluttering or skipping a beat. Palpitations are usually not a serious problem. However, in some cases, you may need further medical evaluation.  CAUSES   Palpitations can be caused by:  · Smoking.  · Caffeine or other stimulants, such as diet pills or energy drinks.  · Alcohol.  · Stress and anxiety.  · Strenuous physical activity.  · Fatigue.  · Certain medicines.  · Heart disease, especially if you have a history of irregular heart rhythms (arrhythmias), such as atrial fibrillation, atrial flutter, or supraventricular tachycardia.  · An improperly working pacemaker or defibrillator.  DIAGNOSIS   To find the cause of your palpitations, your health care provider will take your medical history and perform a physical exam. Your health care provider may also have you take a test called an ambulatory electrocardiogram (ECG). An ECG records your heartbeat patterns over a 24-hour period. You may also have other tests, such as:  · Transthoracic echocardiogram (TTE). During echocardiography, sound waves are used to evaluate how blood flows through your heart.  · Transesophageal echocardiogram (TEE).  · Cardiac monitoring. This allows your health care provider to monitor your heart rate and rhythm in real time.  · Holter monitor. This is a portable device that records your heartbeat and can help diagnose heart arrhythmias. It allows your health care provider to track your heart activity for several days, if needed.  · Stress tests by exercise or by giving medicine that makes the heart beat faster.  TREATMENT   Treatment of palpitations depends on the cause of your symptoms and can vary greatly. Most cases of palpitations do not require any treatment other than time, relaxation, and monitoring your symptoms. Other causes, such as atrial fibrillation, atrial flutter, or supraventricular  tachycardia, usually require further treatment.  HOME CARE INSTRUCTIONS   · Avoid:    Caffeinated coffee, tea, soft drinks, diet pills, and energy drinks.    Chocolate.    Alcohol.  · Stop smoking if you smoke.  · Reduce your stress and anxiety. Things that can help you relax include:    A method of controlling things in your body, such as your heartbeats, with your mind (biofeedback).    Yoga.    Meditation.    Physical activity such as swimming, jogging, or walking.  · Get plenty of rest and sleep.  SEEK MEDICAL CARE IF:   · You continue to have a fast or irregular heartbeat beyond 24 hours.  · Your palpitations occur more often.  SEEK IMMEDIATE MEDICAL CARE IF:  · You have chest pain or shortness of breath.  · You have a severe headache.  · You feel dizzy or you faint.  MAKE SURE YOU:  · Understand these instructions.  · Will watch your condition.  · Will get help right away if you are not doing well or get worse.     This information is not intended to replace advice given to you by your health care provider. Make sure you discuss any questions you have with your health care provider.     Document Released: 06/23/2000 Document Revised: 07/01/2013 Document Reviewed: 08/25/2011  Elsevier Interactive Patient Education ©2016 Elsevier Inc.      Holter Monitoring  A Holter monitor is a small device that is used to detect abnormal heart rhythms. It clips to your clothing and is connected by wires   to flat, sticky disks (electrodes) that attach to your chest. It is worn continuously for 24-48 hours.  HOME CARE INSTRUCTIONS  · Wear your Holter monitor at all times, even while exercising and sleeping, for as long as directed by your health care provider.  · Make sure that the Holter monitor is safely clipped to your clothing or close to your body as recommended by your health care provider.  · Do not get the monitor or wires wet.  · Do not put body lotion or moisturizer on your chest.  · Keep your skin clean.  · Keep a  diary of your daily activities, such as walking and doing chores. If you feel that your heartbeat is abnormal or that your heart is fluttering or skipping a beat:    Record what you are doing when it happens.    Record what time of day the symptoms occur.  · Return your Holter monitor as directed by your health care provider.  · Keep all follow-up visits as directed by your health care provider. This is important.  SEEK IMMEDIATE MEDICAL CARE IF:  · You feel lightheaded or you faint.  · You have trouble breathing.  · You feel pain in your chest, upper arm, or jaw.  · You feel sick to your stomach and your skin is pale, cool, or damp.  · You heartbeat feels unusual or abnormal.     This information is not intended to replace advice given to you by your health care provider. Make sure you discuss any questions you have with your health care provider.     Document Released: 03/24/2004 Document Revised: 07/17/2014 Document Reviewed: 02/02/2014  Elsevier Interactive Patient Education ©2016 Elsevier Inc.

## 2016-01-05 NOTE — ED Notes (Signed)
Pt reports intermittent heart palpitations X 1 week. Pt states last episode started last night, accompanied with mild headache. Denies CP. Denies SOB. Pt alert and oriented X4, active, cooperative, pt in NAD. RR even and unlabored, color WNL.  Ambulatory

## 2016-05-12 IMAGING — US ABDOMEN ULTRASOUND LIMITED
1 series · 14 of 25 positions shown · non-contrast
Comparison: CT scan same day

CLINICAL DATA: Right side abdominal pain, gallstones

EXAM:
US ABDOMEN LIMITED - RIGHT UPPER QUADRANT

[Series 1: abdomen ultrasound limited · 0.17mm/px · 14 of 44 slices shown]
[im 1/44]
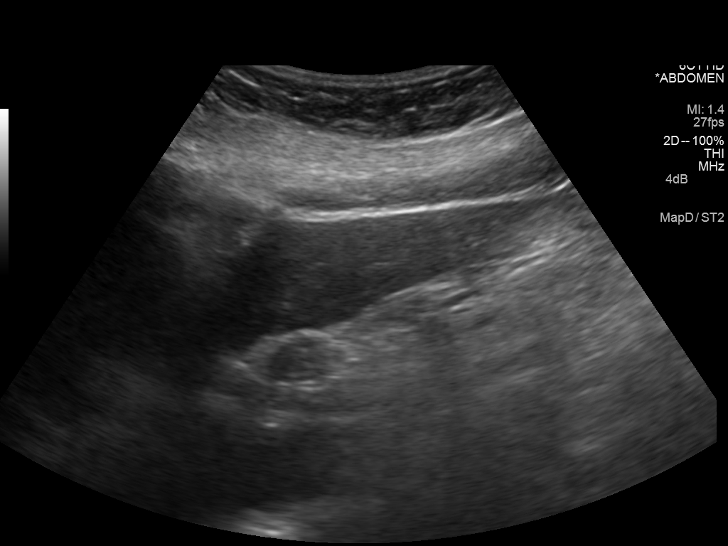
[im 4/44]
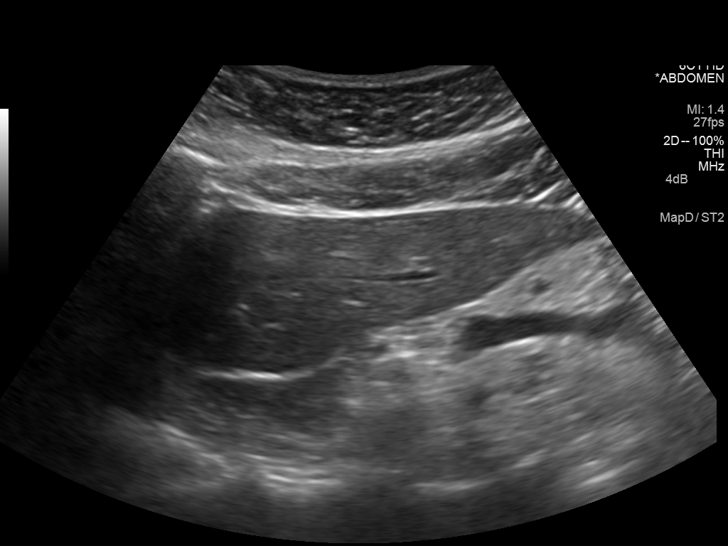
[im 8/44]
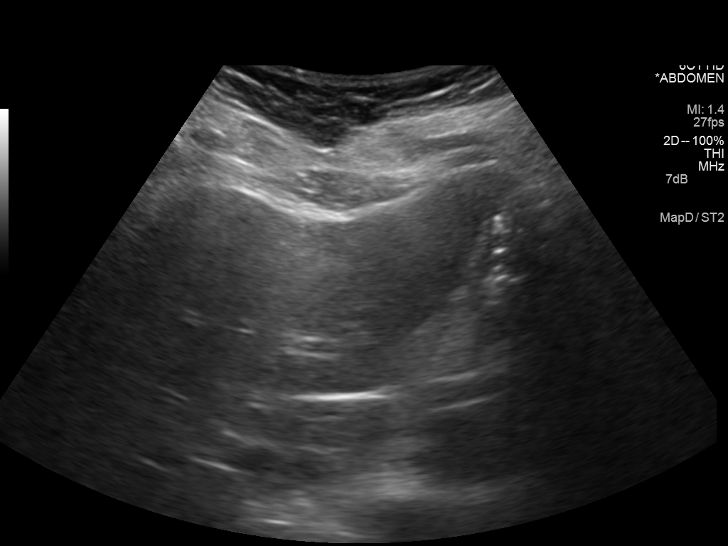
[im 11/44]
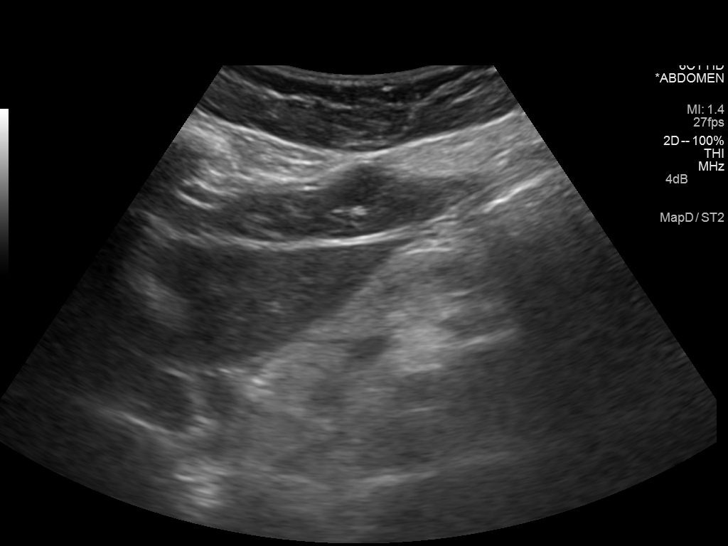
[im 15/44]
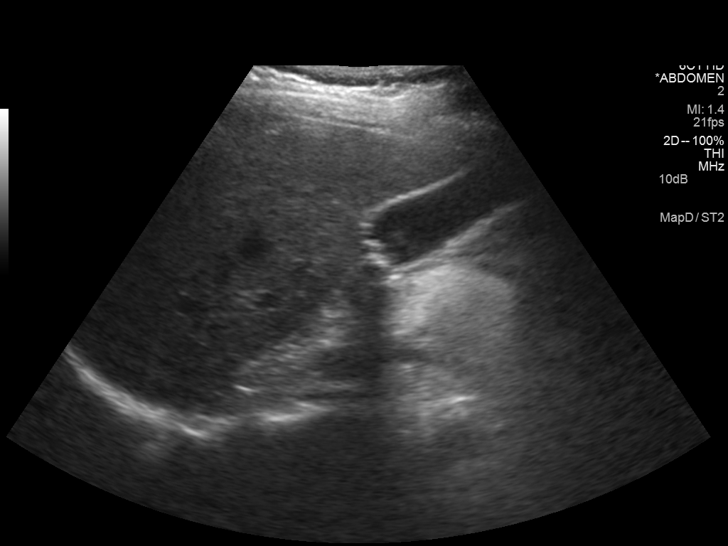
[im 17/44]
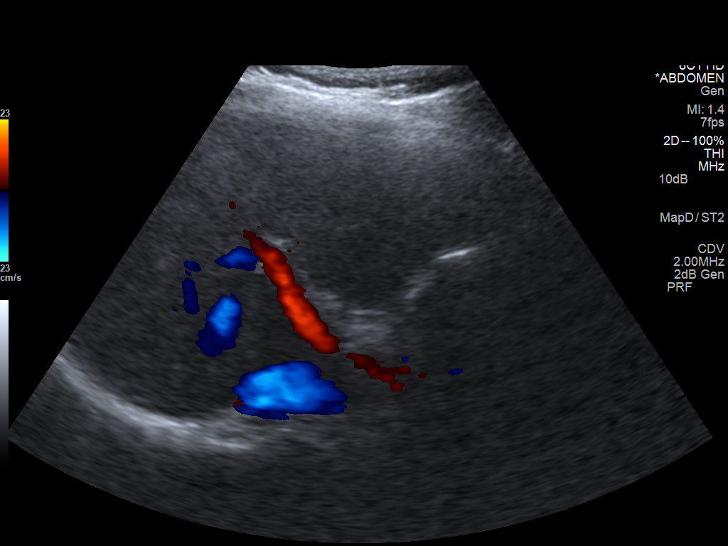
[im 20/44]
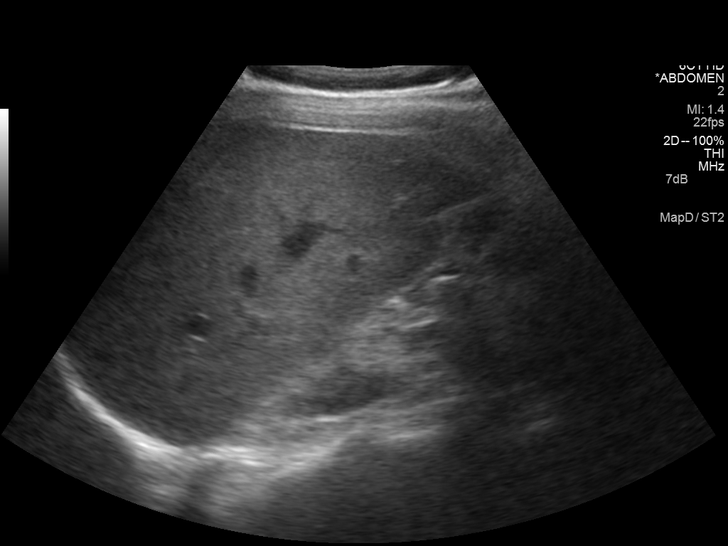
[im 24/44]
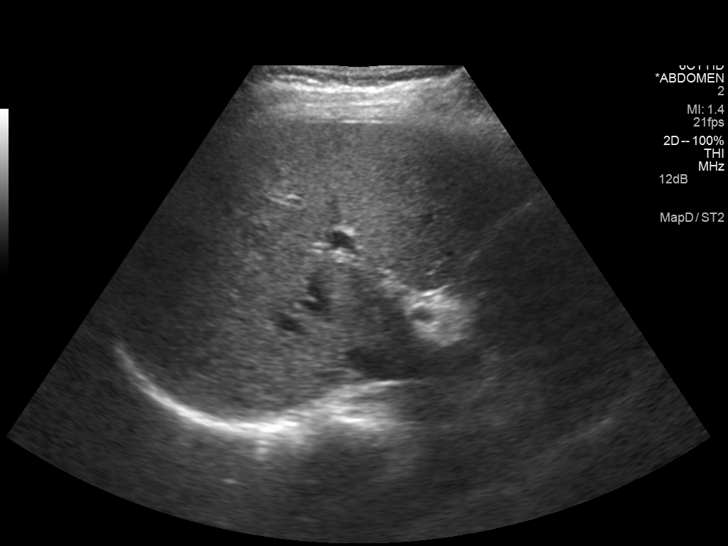
[im 27/44]
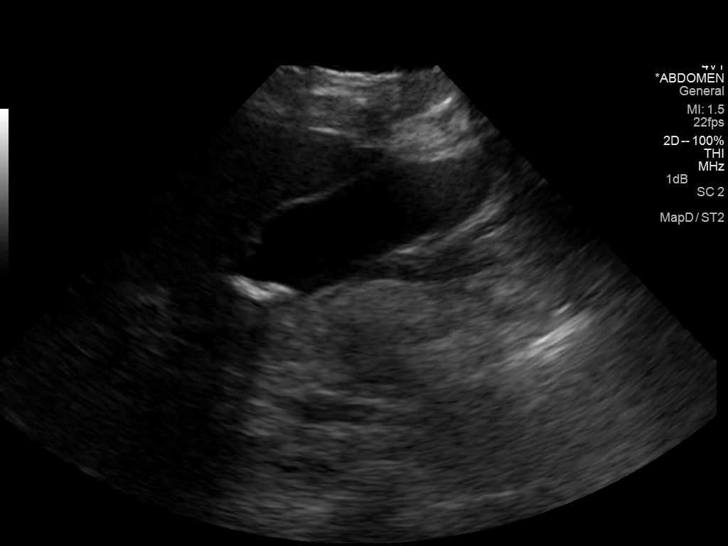
[im 29/44]
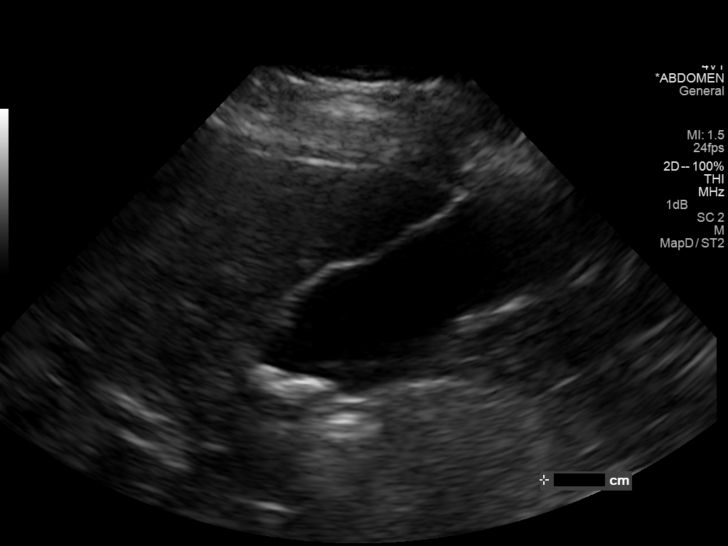
[im 33/44]
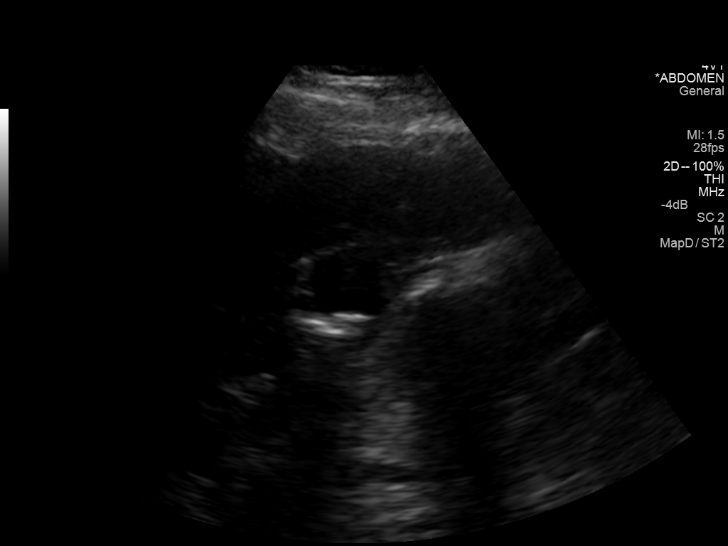
[im 36/44]
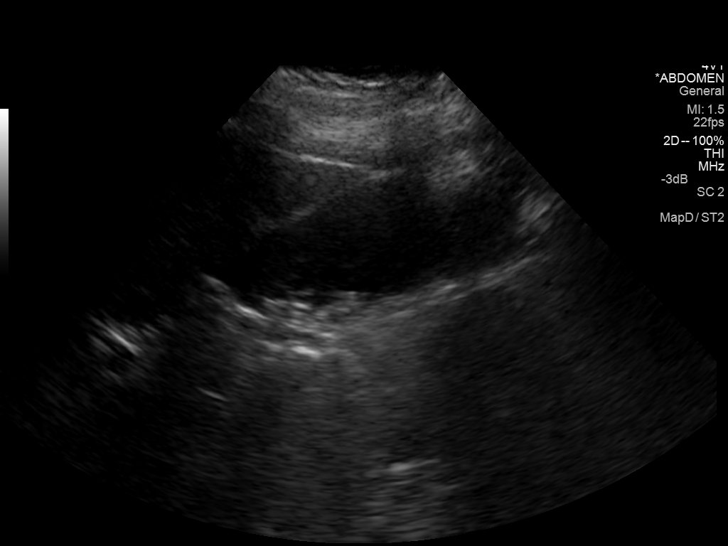
[im 40/44]
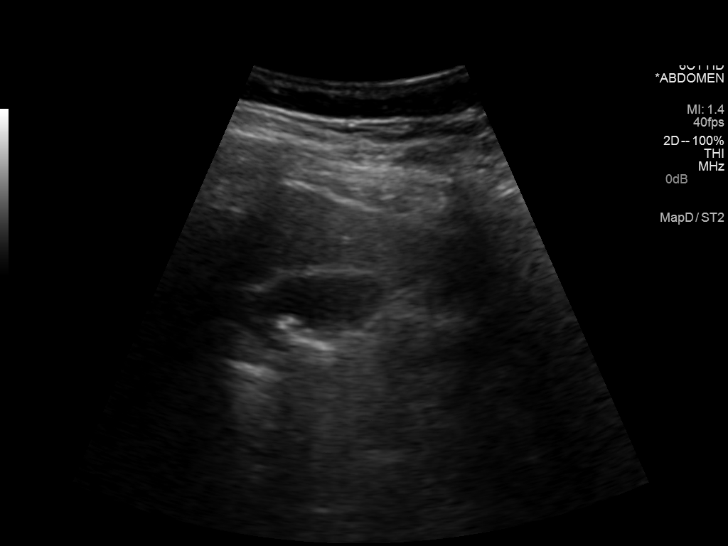
[im 44/44]
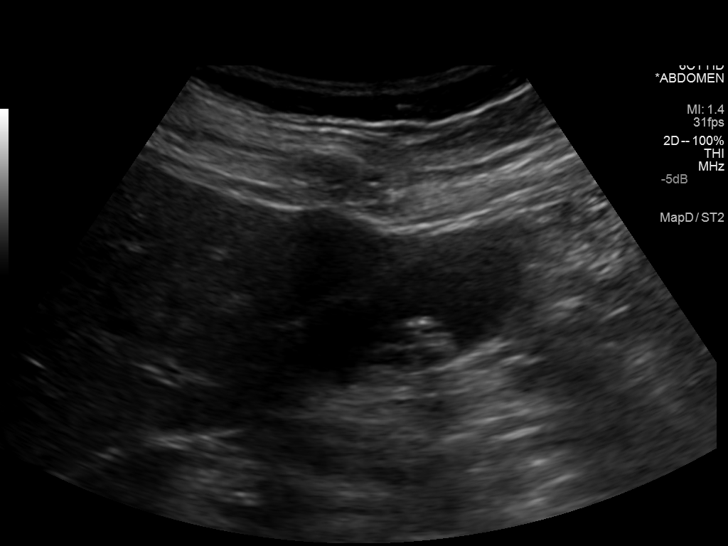

[14 of 25 positions shown; findings below may reference images not displayed]

FINDINGS: Gallbladder:

Multiple mobile gallstones are noted within gallbladder the largest
measures 1 cm. No thickening of gallbladder wall. No sonographic
Murphy's sign.

Common bile duct:

Diameter: 3.2 mm in diameter within normal limits.

Liver:

No focal lesion identified. Mild increased echogenicity of the liver
suspicious for fatty infiltration.
IMPRESSION: 1. Multiple mobile gallstones are noted within gallbladder the
largest measures 1 cm. No sonographic Murphy's sign. Normal CBD.
2. Question mild hepatic fatty infiltration of the liver.

## 2016-05-12 IMAGING — CT CT ABD-PELV W/ CM
2 of 5 series · 15 of 46 positions shown, 17 images · IV contrast (omnipaque)
Comparison: No priors.

CLINICAL DATA: 63-year-old female with 2 month history of
right-sided flank and abdominal pain.

EXAM:
CT ABDOMEN AND PELVIS WITH CONTRAST
TECHNIQUE: Multidetector CT imaging of the abdomen and pelvis was performed
using the standard protocol following bolus administration of
intravenous contrast.
CONTRAST:  80 mL of Omnipaque 300.

[Series 2: routine abd pel with · axial · 0.66mm/px · z∈[-738,-368]mm · 12 of 84 slices shown, 14 images]
[im 5/84  soft-tissue]
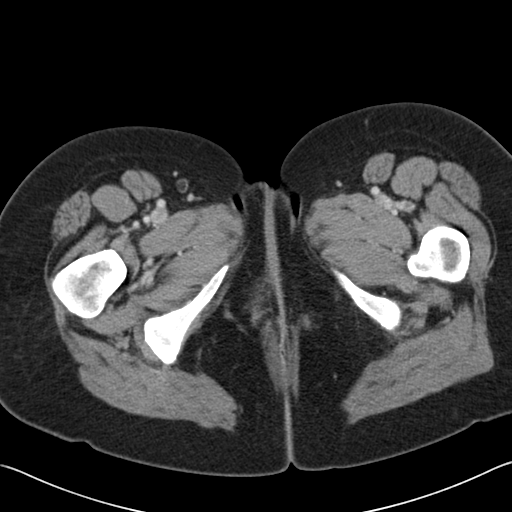
[im 5/84  bone]
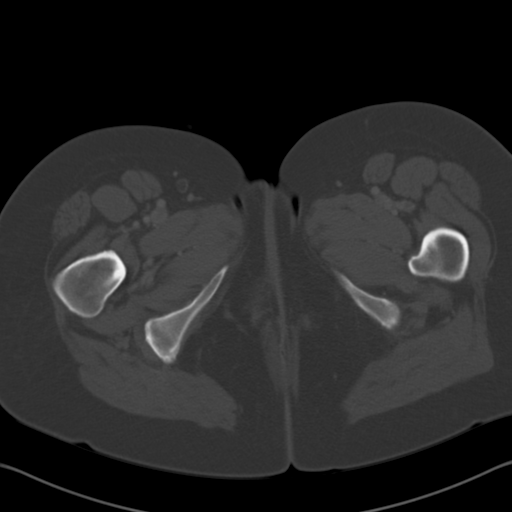
[im 14/84  soft-tissue]
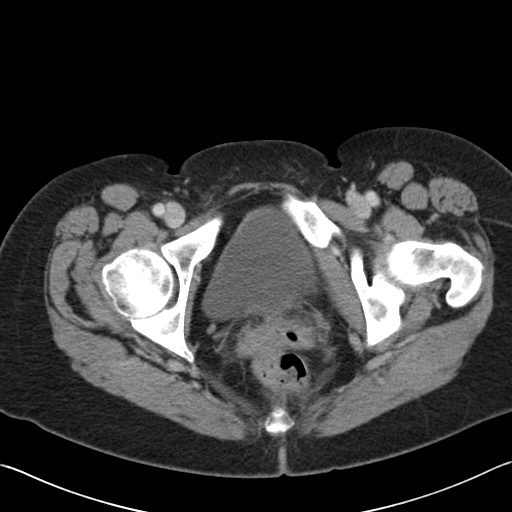
[im 18/84  soft-tissue]
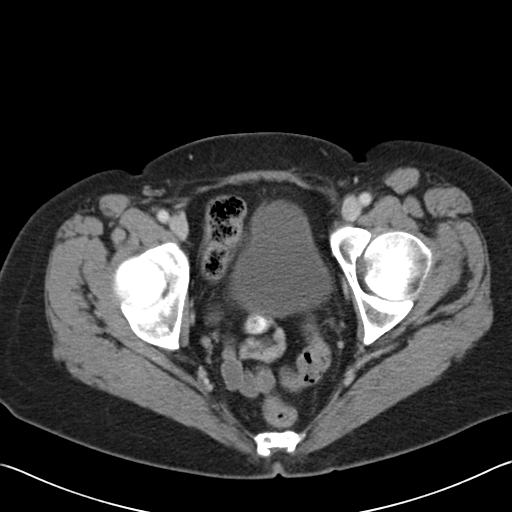
[im 27/84  soft-tissue]
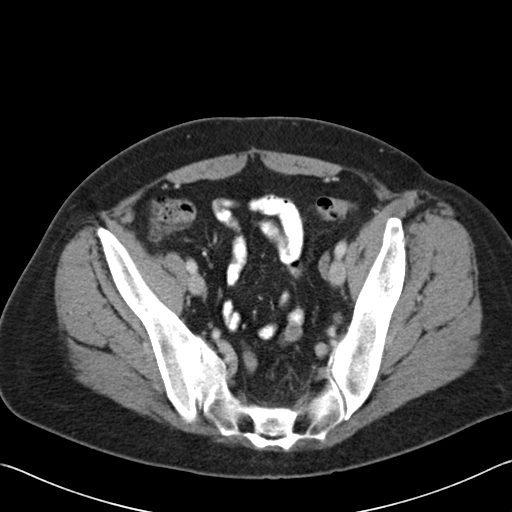
[im 31/84  soft-tissue]
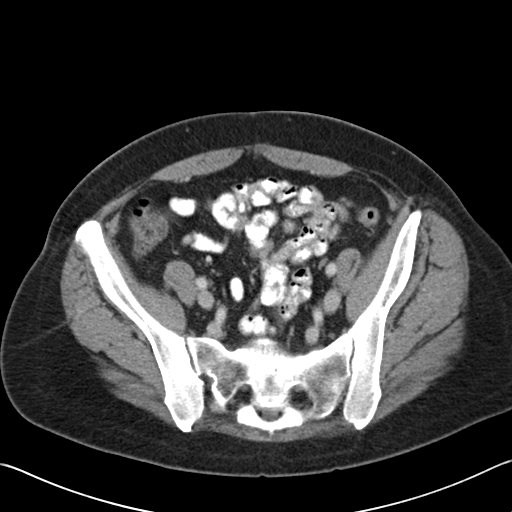
[im 40/84  soft-tissue]
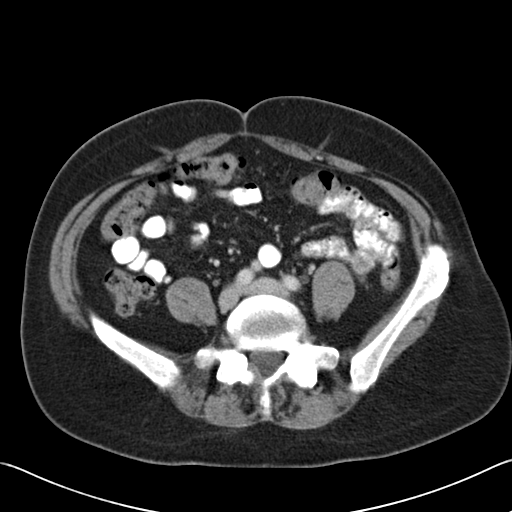
[im 44/84  soft-tissue]
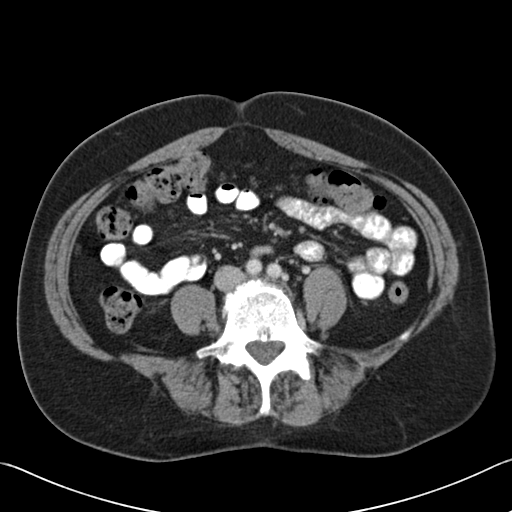
[im 53/84  soft-tissue]
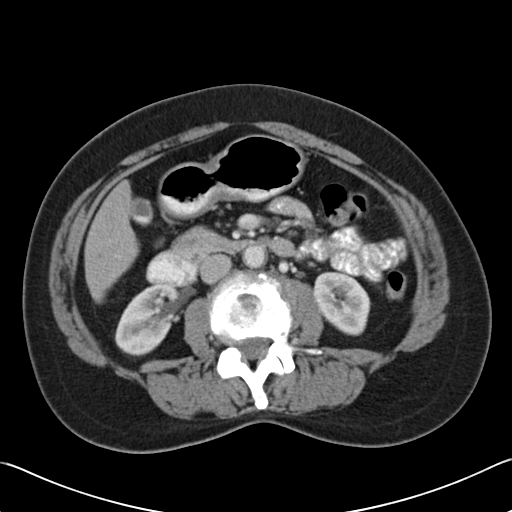
[im 57/84  soft-tissue]
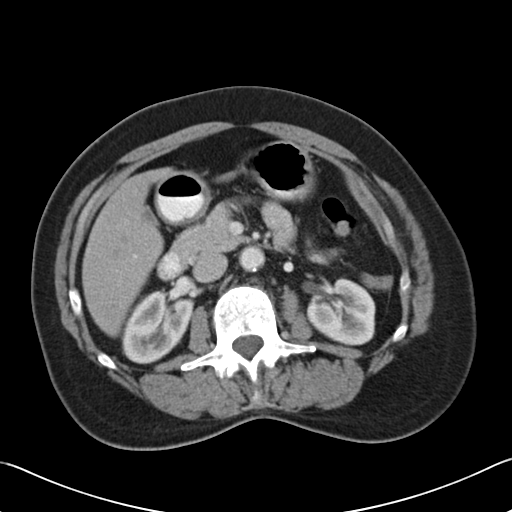
[im 57/84  bone]
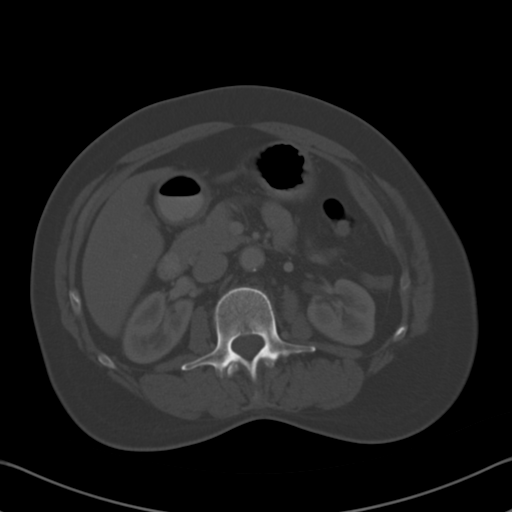
[im 66/84  soft-tissue]
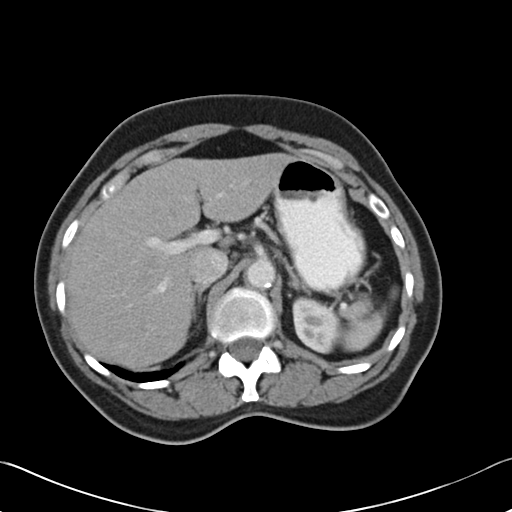
[im 70/84  soft-tissue]
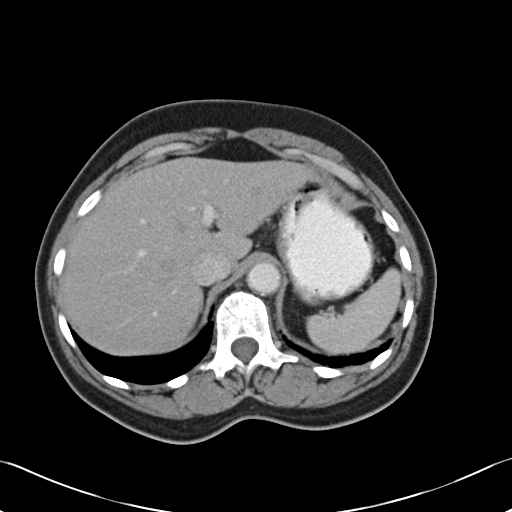
[im 79/84  soft-tissue]
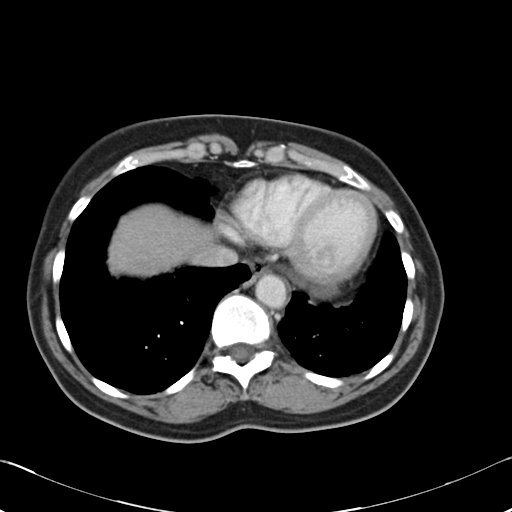

[Series 5: cor routine abd pel with · coronal · 0.59mm/px · 3 of 121 slices shown]
[im 41/121  soft-tissue]
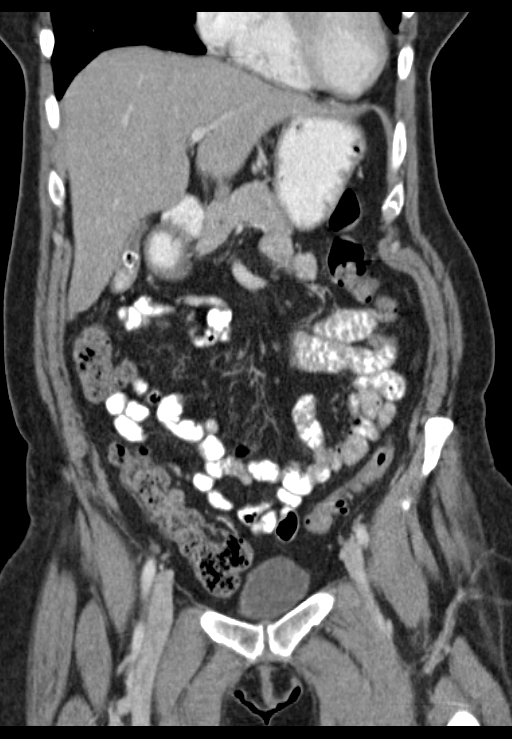
[im 54/121  soft-tissue]
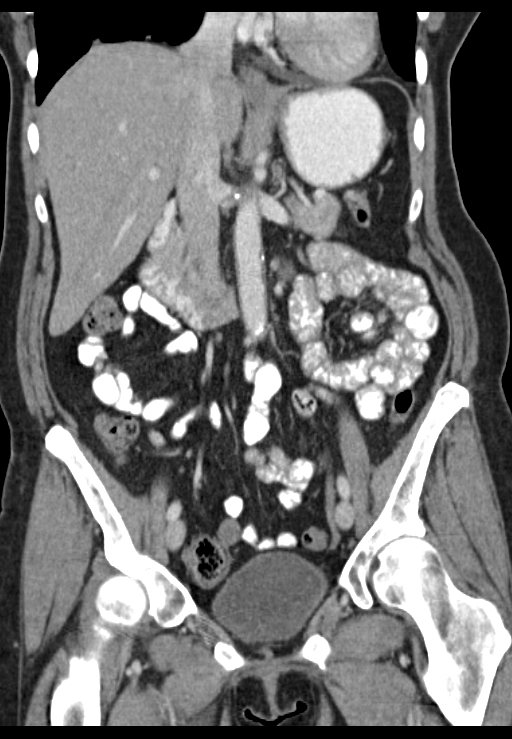
[im 67/121  soft-tissue]
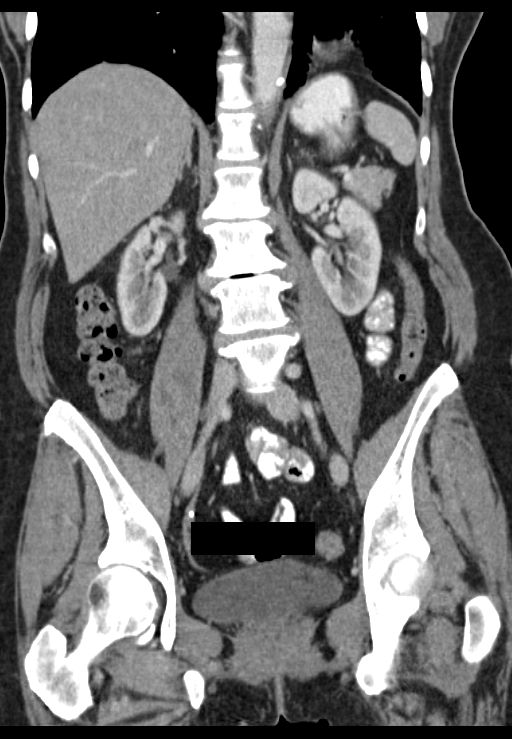

[15 of 46 positions shown; findings below may reference images not displayed]

FINDINGS: Lower chest: Patchy areas of scarring in the lung bases bilaterally.

Hepatobiliary: No cystic or solid hepatic lesions. No intra or
extrahepatic biliary ductal dilatation. Small calcified gallstones
lying dependently in the gallbladder. No signs of acute
cholecystitis at this time.

Pancreas: Unremarkable.

Spleen: Unremarkable.

Adrenals/Urinary Tract: Sub cm low-attenuation lesion in the right
kidney is too small to definitively characterize, but statistically
favored to represent a tiny cyst. The appearance of the kidneys is
otherwise normal bilaterally. No hydroureteronephrosis. Urinary
bladder is normal in appearance. Bilateral adrenal glands are normal
in appearance.

Stomach/Bowel: Normal appearance of the stomach. No pathologic
dilatation of small bowel or colon. The appendix is not confidently
identified and may be surgically absent. Regardless, no inflammatory
changes are noted adjacent to the cecum to suggest presence of an
acute appendicitis at this time.

Vascular/Lymphatic: Mild atherosclerosis throughout the abdominal
and pelvic vasculature, without evidence of aneurysm or dissection.
No lymphadenopathy noted in the abdomen or pelvis.

Reproductive: Status post hysterectomy.  Ovaries are atrophic.

Other: No significant volume of ascites.  No pneumoperitoneum.

Musculoskeletal: There are no aggressive appearing lytic or blastic
lesions noted in the visualized portions of the skeleton.
IMPRESSION: 1. No acute findings in the abdomen or pelvis to account for the
patient's symptoms.
2. Cholelithiasis without evidence to suggest acute cholecystitis at
this time.
3. Atherosclerosis.
4. Additional incidental findings, as above.

## 2019-09-28 ENCOUNTER — Ambulatory Visit: Payer: Self-pay | Attending: Internal Medicine

## 2019-09-28 DIAGNOSIS — Z23 Encounter for immunization: Secondary | ICD-10-CM

## 2019-09-28 NOTE — Progress Notes (Signed)
   Covid-19 Vaccination Clinic  Name:  CIERAH CRADER    MRN: [id number] DOB: January 14, 1951  09/28/2019  Ms. Thammavong was observed post Covid-19 immunization for 15 minutes without incident. She was provided with Vaccine Information Sheet and instruction to access the V-Safe system.   Ms. Standre was instructed to call 911 with any severe reactions post vaccine: Marland Kitchen Difficulty breathing  . Swelling of face and throat  . A fast heartbeat  . A bad rash all over body  . Dizziness and weakness   Immunizations Administered    Name Date Dose VIS Date Route   Pfizer COVID-19 Vaccine 09/28/2019 12:20 PM 0.3 mL 06/20/2019 Intramuscular   Manufacturer: [employer], [employer]   Lot: [id number]   NDC: [address]-0

## 2019-10-01 ENCOUNTER — Encounter: Payer: Self-pay | Admitting: Emergency Medicine

## 2019-10-01 ENCOUNTER — Emergency Department
Admission: EM | Admit: 2019-10-01 | Discharge: 2019-10-01 | Disposition: A | Discharge status: Home / Self Care | Payer: Medicare HMO | Attending: Emergency Medicine | Admitting: Emergency Medicine

## 2019-10-01 ENCOUNTER — Other Ambulatory Visit: Payer: Self-pay

## 2019-10-01 DIAGNOSIS — Z79899 Other long term (current) drug therapy: Secondary | ICD-10-CM | POA: Diagnosis not present

## 2019-10-01 DIAGNOSIS — R5383 Other fatigue: Secondary | ICD-10-CM | POA: Diagnosis present

## 2019-10-01 DIAGNOSIS — R5381 Other malaise: Secondary | ICD-10-CM

## 2019-10-01 LAB — CBC
HCT: 39.6 % (ref 36.0–46.0)
Hemoglobin: 12.9 g/dL (ref 12.0–15.0)
MCH: 28.2 pg (ref 26.0–34.0)
MCHC: 32.6 g/dL (ref 30.0–36.0)
MCV: 86.7 fL (ref 80.0–100.0)
Platelets: 213 10*3/uL (ref 150–400)
RBC: 4.57 MIL/uL (ref 3.87–5.11)
RDW: 13.1 % (ref 11.5–15.5)
WBC: 6.1 10*3/uL (ref 4.0–10.5)
nRBC: 0 % (ref 0.0–0.2)

## 2019-10-01 LAB — URINALYSIS, COMPLETE (UACMP) WITH MICROSCOPIC
Bacteria, UA: NONE SEEN
Bilirubin Urine: NEGATIVE
Glucose, UA: NEGATIVE mg/dL
Hgb urine dipstick: NEGATIVE
Ketones, ur: NEGATIVE mg/dL
Leukocytes,Ua: NEGATIVE
Nitrite: NEGATIVE
Protein, ur: 30 mg/dL — AB
Specific Gravity, Urine: 1.015 (ref 1.005–1.030)
pH: 5 (ref 5.0–8.0)

## 2019-10-01 LAB — BASIC METABOLIC PANEL
Anion gap: 11 (ref 5–15)
BUN: 16 mg/dL (ref 8–23)
CO2: 26 mmol/L (ref 22–32)
Calcium: 9.3 mg/dL (ref 8.9–10.3)
Chloride: 102 mmol/L (ref 98–111)
Creatinine, Ser: 1.2 mg/dL — ABNORMAL HIGH (ref 0.44–1.00)
GFR calc Af Amer: 53 mL/min — ABNORMAL LOW (ref >60)
GFR calc non Af Amer: 46 mL/min — ABNORMAL LOW (ref >60)
Glucose, Bld: 111 mg/dL — ABNORMAL HIGH (ref 70–99)
Potassium: 3.9 mmol/L (ref 3.5–5.1)
Sodium: 139 mmol/L (ref 135–145)

## 2019-10-01 MED ORDER — ONDANSETRON 4 MG PO TBDP
ORAL_TABLET | ORAL | Status: AC
  Filled 2019-10-01: qty 1

## 2019-10-01 MED ORDER — ONDANSETRON 4 MG PO TBDP: 4.0000 mg | ORAL_TABLET | Freq: Three times a day (TID) | ORAL | 0 refills | Status: DC | PRN

## 2019-10-01 MED ORDER — ONDANSETRON HCL 4 MG/2ML IJ SOLN: 4.0000 mg | Freq: Once | INTRAMUSCULAR | Status: DC

## 2019-10-01 MED ORDER — FAMOTIDINE 20 MG PO TABS: 20.0000 mg | ORAL_TABLET | Freq: Two times a day (BID) | ORAL | 0 refills | Status: DC

## 2019-10-01 MED ORDER — FAMOTIDINE 20 MG PO TABS
40.0000 mg | ORAL_TABLET | Freq: Once | ORAL | Status: AC
  Administered 2019-10-01: 40 mg via ORAL
  Filled 2019-10-01: qty 2

## 2019-10-01 MED ORDER — ONDANSETRON 4 MG PO TBDP
8.0000 mg | ORAL_TABLET | Freq: Once | ORAL | Status: AC
  Administered 2019-10-01: 8 mg via ORAL

## 2019-10-01 NOTE — ED Notes (Signed)
Pt po challenged with gingerale and saltine crackers ?

## 2019-10-01 NOTE — ED Triage Notes (Signed)
Pt in via POV, reports having initial Covid shot Sunday, since feeling very fatigued, having chills, and decreased appetite.  Vitals WDL, NAD noted at this time.

## 2019-10-01 NOTE — ED Provider Notes (Signed)
[facility] Emergency Department Provider Note  ____________________________________________  Time seen: Approximately 8:15 PM  I have reviewed the triage vital signs and the nursing notes.   HISTORY  Chief Complaint Fatigue    HPI Lori Holland is a 69 y.o. female with no significant past medical history who reports onset of fatigue 5 days ago, gradual, no other associated symptoms.  3 days ago she had her second Covid vaccine, and since then has had chills, decreased appetite, nausea, more pronounced fatigue.  Denies any focal pain, no shortness of breath, no fever, no cough.  No neck pain or headache or vision changes.  No paresthesias or weakness.  No falls or trauma.  No dysuria.  No vomiting or diarrhea.  Symptoms are constant, waxing and waning, no aggravating or alleviating factors.  Does not have any appetite for eating, but has been able to keep taking sips of fluid.      Past Medical History:  Diagnosis Date  . Bulging disc   . Heart murmur      There are no problems to display for this patient.    Past Surgical History:  Procedure Laterality Date  . ABDOMINAL HYSTERECTOMY    . TONSILLECTOMY       Prior to Admission medications   Medication Sig Start Date End Date Taking? Authorizing Provider  famotidine (PEPCID) 20 MG tablet Take 1 tablet (20 mg total) by mouth 2 (two) times daily. 10/01/19   Sharman Cheek, MD  methocarbamol (ROBAXIN) 500 MG tablet Take 1 tablet (500 mg total) by mouth 2 (two) times daily. 01/11/14   Antony Madura, PA-C  ondansetron (ZOFRAN ODT) 4 MG disintegrating tablet Take 1 tablet (4 mg total) by mouth every 8 (eight) hours as needed for nausea or vomiting. 10/01/19   Sharman Cheek, MD  traMADol (ULTRAM) 50 MG tablet Take 1 tablet (50 mg total) by mouth every 6 (six) hours as needed. 01/11/14   Antony Madura, PA-C  traZODone (DESYREL) 50 MG tablet Take 1 tablet (50 mg total) by mouth at bedtime. 01/05/16    Minna Antis, MD     Allergies Amoxicillin, Iodine, and Sulfa antibiotics   No family history on file.  Social History Social History   Tobacco Use  . Smoking status: Never Smoker  . Smokeless tobacco: Never Used  Substance Use Topics  . Alcohol use: No  . Drug use: Never    Review of Systems  Constitutional:   No fever positive chills.  ENT:   No sore throat. No rhinorrhea. Cardiovascular:   No chest pain or syncope. Respiratory:   No dyspnea or cough. Gastrointestinal:   Negative for abdominal pain, vomiting and diarrhea.  Musculoskeletal:   Negative for focal pain or swelling All other systems reviewed and are negative except as documented above in ROS and HPI.  ____________________________________________   PHYSICAL EXAM:  VITAL SIGNS: ED Triage Vitals  Enc Vitals Group     BP 10/01/19 1747 (!) 104/50     Pulse Rate 10/01/19 1747 84     Resp 10/01/19 1747 20     Temp 10/01/19 1747 99 F (37.2 C)     Temp Source 10/01/19 1747 Oral     SpO2 10/01/19 1747 98 %     Weight 10/01/19 1748 145 lb (65.8 kg)     Height 10/01/19 1748 5\' 6"  (1.676 m)     Head Circumference --      Peak Flow --      Pain  Score 10/01/19 1800 0     Pain Loc --      Pain Edu? --      Excl. in [place]? --     Vital signs reviewed, nursing assessments reviewed.   Constitutional:   Alert and oriented. Non-toxic appearance. Eyes:   Conjunctivae are normal. EOMI. PERRL. ENT      Head:   Normocephalic and atraumatic.      Nose:   Normal.      Mouth/Throat:   Moist mucous membranes.      Neck:   No meningismus. Full ROM. Hematological/Lymphatic/Immunilogical:   No cervical lymphadenopathy. Cardiovascular:   RRR. Symmetric bilateral radial and DP pulses.  No murmurs. Cap refill less than 2 seconds. Respiratory:   Normal respiratory effort without tachypnea/retractions. Breath sounds are clear and equal bilaterally. No wheezes/rales/rhonchi. Gastrointestinal:   Soft and nontender. Non  distended. There is no CVA tenderness.  No rebound, rigidity, or guarding. Musculoskeletal:   Normal range of motion in all extremities. No joint effusions.  No lower extremity tenderness.  No edema. Neurologic:   Normal speech and language.  Motor grossly intact. No acute focal neurologic deficits are appreciated.  Skin:    Skin is warm, dry and intact. No rash noted.  No petechiae, purpura, or bullae.  ____________________________________________    LABS (pertinent positives/negatives) (all labs ordered are listed, but only abnormal results are displayed) Labs Reviewed  BASIC METABOLIC PANEL - Abnormal; Notable for the following components:      Result Value   Glucose, Bld 111 (*)    Creatinine, Ser 1.20 (*)    GFR calc non Af Amer 46 (*)    GFR calc Af Amer 53 (*)    All other components within normal limits  URINALYSIS, COMPLETE (UACMP) WITH MICROSCOPIC - Abnormal; Notable for the following components:   Color, Urine YELLOW (*)    APPearance HAZY (*)    Protein, ur 30 (*)    All other components within normal limits  CBC   ____________________________________________   EKG  Interpreted by me  Date: 10/01/2019  Rate: 81  Rhythm: normal sinus rhythm  QRS Axis: normal  Intervals: normal  ST/T Wave abnormalities: normal  Conduction Disutrbances: none  Narrative Interpretation: unremarkable      ____________________________________________    RADIOLOGY  No results found.  ____________________________________________   PROCEDURES Procedures  ____________________________________________    CLINICAL IMPRESSION / ASSESSMENT AND PLAN / ED COURSE  Medications ordered in the ED: Medications  ondansetron (ZOFRAN-ODT) 4 MG disintegrating tablet (has no administration in time range)  famotidine (PEPCID) tablet 40 mg (40 mg Oral Given 10/01/19 2030)  ondansetron (ZOFRAN-ODT) disintegrating tablet 8 mg (8 mg Oral Given 10/01/19 2029)    Pertinent labs &  imaging results that were available during my care of the patient were reviewed by me and considered in my medical decision making (see chart for details).  Lori Holland was evaluated in Emergency Department on 10/01/2019 for the symptoms described in the history of present illness. She was evaluated in the context of the global COVID-19 pandemic, which necessitated consideration that the patient might be at risk for infection with the SARS-CoV-2 virus that causes COVID-19. Institutional protocols and algorithms that pertain to the evaluation of patients at risk for COVID-19 are in a state of rapid change based on information released by regulatory bodies including the CDC and federal and state organizations. These policies and algorithms were followed during the patient's care in the ED.  Patient presents with fatigue, malaise, decreased appetite.  Vital signs are normal, labs unremarkable.  Doubt Covid, pneumonia, UTI, meningitis, encephalitis, ACS.  Possible viral syndrome versus vaccine side effects versus dehydration.  Lab panel is unremarkable.  Will give Pepcid, Zofran, p.o. trial  Clinical Course as of Sep 30 2049  Wed Oct 01, 2019  2050 Tolerating PO. Stable for DC home.    [PS]    Clinical Course User Index [PS] Sharman Cheek, MD     ____________________________________________   FINAL CLINICAL IMPRESSION(S) / ED DIAGNOSES    Final diagnoses:  Malaise and fatigue     ED Discharge Orders         Ordered    famotidine (PEPCID) 20 MG tablet  2 times daily     10/01/19 2051    ondansetron (ZOFRAN ODT) 4 MG disintegrating tablet  Every 8 hours PRN     10/01/19 2051          Portions of this note were generated with dragon dictation software. Dictation errors may occur despite best attempts at proofreading.   Sharman Cheek, MD 10/01/19 2052

## 2019-10-01 NOTE — ED Notes (Addendum)
Pt reports that Thursday-Friday she was weak - received Covid vaccine Sunday and has had chills/weakness/decreased appetite since vaccine - pt drinking pedialyte without difficulty

## 2019-10-19 ENCOUNTER — Ambulatory Visit: Payer: Self-pay

## 2020-12-23 ENCOUNTER — Other Ambulatory Visit: Payer: Self-pay | Admitting: Student

## 2020-12-23 DIAGNOSIS — I83813 Varicose veins of bilateral lower extremities with pain: Secondary | ICD-10-CM

## 2020-12-23 DIAGNOSIS — R0989 Other specified symptoms and signs involving the circulatory and respiratory systems: Secondary | ICD-10-CM

## 2020-12-23 DIAGNOSIS — M79671 Pain in right foot: Secondary | ICD-10-CM

## 2020-12-30 ENCOUNTER — Ambulatory Visit
Admission: RE | Admit: 2020-12-30 | Discharge: 2020-12-30 | Disposition: A | Discharge status: Home / Self Care | Payer: Medicare HMO | Source: Ambulatory Visit | Attending: Student | Admitting: Student

## 2020-12-30 DIAGNOSIS — M79672 Pain in left foot: Secondary | ICD-10-CM

## 2020-12-30 DIAGNOSIS — I83813 Varicose veins of bilateral lower extremities with pain: Secondary | ICD-10-CM

## 2020-12-30 DIAGNOSIS — R0989 Other specified symptoms and signs involving the circulatory and respiratory systems: Secondary | ICD-10-CM

## 2021-03-09 ENCOUNTER — Other Ambulatory Visit: Payer: Self-pay

## 2021-03-09 ENCOUNTER — Encounter (HOSPITAL_COMMUNITY): Payer: Self-pay | Admitting: Emergency Medicine

## 2021-03-09 ENCOUNTER — Emergency Department (HOSPITAL_COMMUNITY)
Admission: EM | Admit: 2021-03-09 | Discharge: 2021-03-09 | Disposition: A | Discharge status: Home / Self Care | Payer: Medicare Other | Attending: Student | Admitting: Student

## 2021-03-09 DIAGNOSIS — R0602 Shortness of breath: Secondary | ICD-10-CM | POA: Diagnosis present

## 2021-03-09 DIAGNOSIS — R42 Dizziness and giddiness: Secondary | ICD-10-CM | POA: Insufficient documentation

## 2021-03-09 DIAGNOSIS — R1110 Vomiting, unspecified: Secondary | ICD-10-CM | POA: Diagnosis not present

## 2021-03-09 LAB — CBC WITH DIFFERENTIAL/PLATELET
Abs Immature Granulocytes: 0.03 10*3/uL (ref 0.00–0.07)
Basophils Absolute: 0 10*3/uL (ref 0.0–0.1)
Basophils Relative: 0 %
Eosinophils Absolute: 0.1 10*3/uL (ref 0.0–0.5)
Eosinophils Relative: 1 %
HCT: 39 % (ref 36.0–46.0)
Hemoglobin: 12.3 g/dL (ref 12.0–15.0)
Immature Granulocytes: 0 %
Lymphocytes Relative: 46 %
Lymphs Abs: 3.9 10*3/uL (ref 0.7–4.0)
MCH: 28 pg (ref 26.0–34.0)
MCHC: 31.5 g/dL (ref 30.0–36.0)
MCV: 88.6 fL (ref 80.0–100.0)
Monocytes Absolute: 0.3 10*3/uL (ref 0.1–1.0)
Monocytes Relative: 4 %
Neutro Abs: 4.1 10*3/uL (ref 1.7–7.7)
Neutrophils Relative %: 49 %
Platelets: 279 10*3/uL (ref 150–400)
RBC: 4.4 MIL/uL (ref 3.87–5.11)
RDW: 13.2 % (ref 11.5–15.5)
WBC: 8.4 10*3/uL (ref 4.0–10.5)
nRBC: 0 % (ref 0.0–0.2)

## 2021-03-09 LAB — COMPREHENSIVE METABOLIC PANEL
ALT: 17 U/L (ref 0–44)
AST: 22 U/L (ref 15–41)
Albumin: 4 g/dL (ref 3.5–5.0)
Alkaline Phosphatase: 67 U/L (ref 38–126)
Anion gap: 10 (ref 5–15)
BUN: 9 mg/dL (ref 8–23)
CO2: 23 mmol/L (ref 22–32)
Calcium: 9.4 mg/dL (ref 8.9–10.3)
Chloride: 106 mmol/L (ref 98–111)
Creatinine, Ser: 0.83 mg/dL (ref 0.44–1.00)
GFR, Estimated: >60 mL/min (ref >60)
Glucose, Bld: 148 mg/dL — ABNORMAL HIGH (ref 70–99)
Potassium: 3.2 mmol/L — ABNORMAL LOW (ref 3.5–5.1)
Sodium: 139 mmol/L (ref 135–145)
Total Bilirubin: 0.6 mg/dL (ref 0.3–1.2)
Total Protein: 7.6 g/dL (ref 6.5–8.1)

## 2021-03-09 LAB — TROPONIN I (HIGH SENSITIVITY): Troponin I (High Sensitivity): 7 ng/L (ref <18)

## 2021-03-09 MED ORDER — ONDANSETRON 4 MG PO TBDP
8.0000 mg | ORAL_TABLET | Freq: Once | ORAL | Status: AC
  Administered 2021-03-09: 8 mg via ORAL

## 2021-03-09 MED ORDER — LORAZEPAM 1 MG PO TABS
0.5000 mg | ORAL_TABLET | Freq: Once | ORAL | Status: AC
  Administered 2021-03-09: 0.5 mg via ORAL
  Filled 2021-03-09: qty 1

## 2021-03-09 MED ORDER — LACTATED RINGERS IV BOLUS
1000.0000 mL | Freq: Once | INTRAVENOUS | Status: AC
  Administered 2021-03-09: 1000 mL via INTRAVENOUS

## 2021-03-09 MED ORDER — MECLIZINE HCL 25 MG PO TABS
25.0000 mg | ORAL_TABLET | Freq: Once | ORAL | Status: AC
  Administered 2021-03-09: 25 mg via ORAL
  Filled 2021-03-09: qty 1

## 2021-03-09 NOTE — ED Triage Notes (Signed)
Pt arrives via EMS for SOB/nausea/dizzy. Pt states this feels like vertigo. Pt has hx of vertigo. 100% on room air, BP 180/90, HR 56, 99% room air, CBG 139. EMS gave 4mg  zofran.

## 2021-03-09 NOTE — ED Provider Notes (Signed)
[facility] EMERGENCY DEPARTMENT Provider Note   CSN: [id number] Arrival date & time: 03/09/21  1359     History Chief Complaint  Patient presents with   Shortness of Breath   Dizziness   Nausea    Lori Holland is a 70 y.o. female with PMH vertigo who presents the emergency department for evaluation of dizziness, nausea, vomiting.  Chief complaint states shortness of breath but on my evaluation the patient does not complain of shortness of breath at this time.  Patient states that she was at her hair salon today when she had sudden onset vertigo similar to her previous "vertigo attacks.  She states that she has had nausea and a single episode of vomiting due to the vertigo.  She states that she has medication for this at home but did not bring it to the hair salon today.  Denies chest pain, abdominal pain, numbness, tingling, weakness, visual deficits or other systemic symptoms.  She states that her vertigo resolves when her head is still, and worsens when turning her head to the left.   Shortness of Breath Associated symptoms: vomiting   Associated symptoms: no abdominal pain, no chest pain, no cough, no ear pain, no fever, no rash and no sore throat   Dizziness Associated symptoms: nausea and vomiting   Associated symptoms: no chest pain, no palpitations and no shortness of breath       Past Medical History:  Diagnosis Date   Bulging disc    Heart murmur     There are no problems to display for this patient.   Past Surgical History:  Procedure Laterality Date   ABDOMINAL HYSTERECTOMY     TONSILLECTOMY       OB History   No obstetric history on file.     No family history on file.  Social History   Tobacco Use   Smoking status: Never   Smokeless tobacco: Never  Vaping Use   Vaping Use: Never used  Substance Use Topics   Alcohol use: No   Drug use: Never    Home Medications Prior to Admission medications   Medication Sig Start  Date End Date Taking? Authorizing Provider  famotidine (PEPCID) 20 MG tablet Take 1 tablet (20 mg total) by mouth 2 (two) times daily. 10/01/19   Sharman Cheek, MD  methocarbamol (ROBAXIN) 500 MG tablet Take 1 tablet (500 mg total) by mouth 2 (two) times daily. 01/11/14   Antony Madura, PA-C  ondansetron (ZOFRAN ODT) 4 MG disintegrating tablet Take 1 tablet (4 mg total) by mouth every 8 (eight) hours as needed for nausea or vomiting. 10/01/19   Sharman Cheek, MD  traMADol (ULTRAM) 50 MG tablet Take 1 tablet (50 mg total) by mouth every 6 (six) hours as needed. 01/11/14   Antony Madura, PA-C  traZODone (DESYREL) 50 MG tablet Take 1 tablet (50 mg total) by mouth at bedtime. 01/05/16   Minna Antis, MD    Allergies    Amoxicillin, Iodine, and Sulfa antibiotics  Review of Systems   Review of Systems  Constitutional:  Negative for chills and fever.  HENT:  Negative for ear pain and sore throat.   Eyes:  Negative for pain and visual disturbance.  Respiratory:  Negative for cough and shortness of breath.   Cardiovascular:  Negative for chest pain and palpitations.  Gastrointestinal:  Positive for nausea and vomiting. Negative for abdominal pain.  Genitourinary:  Negative for dysuria and hematuria.  Musculoskeletal:  Negative for arthralgias  and back pain.  Skin:  Negative for color change and rash.  Neurological:  Positive for dizziness. Negative for seizures and syncope.  All other systems reviewed and are negative.  Physical Exam Updated Vital Signs BP (!) 141/69   Pulse (!) 53   Temp 98 F (36.7 C) (Oral)   Resp 13   SpO2 99%   Physical Exam Vitals and nursing note reviewed.  Constitutional:      General: She is not in acute distress.    Appearance: She is well-developed.  HENT:     Head: Normocephalic and atraumatic.  Eyes:     Conjunctiva/sclera: Conjunctivae normal.  Cardiovascular:     Rate and Rhythm: Normal rate and regular rhythm.     Heart sounds: No murmur  heard. Pulmonary:     Effort: Pulmonary effort is normal. No respiratory distress.     Breath sounds: Normal breath sounds.  Abdominal:     Palpations: Abdomen is soft.     Tenderness: There is no abdominal tenderness.  Musculoskeletal:     Cervical back: Neck supple.  Skin:    General: Skin is warm and dry.  Neurological:     General: No focal deficit present.     Mental Status: She is alert and oriented to person, place, and time.     Cranial Nerves: No cranial nerve deficit.    ED Results / Procedures / Treatments   Labs (all labs ordered are listed, but only abnormal results are displayed) Labs Reviewed  COMPREHENSIVE METABOLIC PANEL - Abnormal; Notable for the following components:      Result Value   Potassium 3.2 (*)    Glucose, Bld 148 (*)    All other components within normal limits  CBC WITH DIFFERENTIAL/PLATELET  TROPONIN I (HIGH SENSITIVITY)    EKG EKG Interpretation  Date/Time:  Wednesday March 09 2021 14:13:38 EDT Ventricular Rate:  62 PR Interval:  166 QRS Duration: 94 QT Interval:  456 QTC Calculation: 462 R Axis:   80 Text Interpretation: Sinus rhythm with Premature atrial complexes Otherwise normal ECG wavy basline Confirmed by Kyro Joswick ([id number]) on 03/09/2021 4:50:10 PM  Radiology No results found.  Procedures Procedures   Medications Ordered in ED Medications  LORazepam (ATIVAN) tablet 0.5 mg (has no administration in time range)  lactated ringers bolus 1,000 mL (has no administration in time range)  ondansetron (ZOFRAN-ODT) disintegrating tablet 8 mg (8 mg Oral Given 03/09/21 1425)  meclizine (ANTIVERT) tablet 25 mg (25 mg Oral Given 03/09/21 1742)    ED Course  I have reviewed the triage vital signs and the nursing notes.  Pertinent labs & imaging results that were available during my care of the patient were reviewed by me and considered in my medical decision making (see chart for details).    MDM Rules/Calculators/A&P                            Patient seen emergency department for evaluation of vertigo.  Physical exam is largely unremarkable with no focal motor or sensory deficits, no cranial nerve deficits.  Patient with left beating nystagmus on lateral gaze.  Epley maneuvers attempted at bedside which unfortunately did not resolve the patient's vertigo, but instead induced vomiting.  She was initially given 8 mg Zofran but patient continued to vomit through this and she was then given 0.5 mg of Ativan.  On reevaluation, her symptoms have completely resolved and she is ambulating the hallway  without difficulty and requesting discharge.  I have low suspicion for central stroke as the patient's vertigo is fatigable and not associated with any focal neurodeficits at this time.  She was given return precautions which he voiced understanding and she was discharged. Final Clinical Impression(s) / ED Diagnoses Final diagnoses:  None    Rx / DC Orders ED Discharge Orders     None        Pennye Beeghly, Wyn Forster, MD 03/09/21 2332

## 2021-03-09 NOTE — ED Provider Notes (Signed)
Emergency Medicine Provider Triage Evaluation Note  Lori Holland , a 70 y.o. female  was evaluated in triage.  Pt complains of history of vertigo and reports she was at barbershop getting her hair done when she suddenly felt dizzy, nauseous, and felt a pop in her ears. She continue to endorse nausea, dizziness and pain in her ears. She has vomited several times. She denies LOC, chest pain, shortness of breath, recent infection, fever.  Review of Systems  Positive: As above  Negative: As above  Physical Exam  BP (!) 161/79 (BP Location: Left Arm)   Pulse (!) 54   Temp 98 F (36.7 C) (Oral)   Resp 16   SpO2 97%  Gen:   Awake, pale, diaphoretic, leaning over her emesis bag Resp:  Normal effort  MSK:   Moves extremities without difficulty  Other:    Medical Decision Making  Medically screening exam initiated at 2:14 PM.  Appropriate orders placed.  Lori Holland was informed that the remainder of the evaluation will be completed by another provider, this initial triage assessment does not replace that evaluation, and the importance of remaining in the ED until their evaluation is complete.  Dizziness, nausea, vomiting   Olene Floss, PA-C 03/09/21 1416    Mancel Bale, MD 03/10/21 1023

## 2022-09-20 ENCOUNTER — Ambulatory Visit (INDEPENDENT_AMBULATORY_CARE_PROVIDER_SITE_OTHER): Payer: Medicare Other

## 2022-09-20 ENCOUNTER — Ambulatory Visit (INDEPENDENT_AMBULATORY_CARE_PROVIDER_SITE_OTHER): Payer: Medicare Other | Admitting: Orthopaedic Surgery

## 2022-09-20 DIAGNOSIS — M1712 Unilateral primary osteoarthritis, left knee: Secondary | ICD-10-CM

## 2022-09-20 DIAGNOSIS — G8929 Other chronic pain: Secondary | ICD-10-CM | POA: Diagnosis not present

## 2022-09-20 DIAGNOSIS — M25562 Pain in left knee: Secondary | ICD-10-CM

## 2022-09-20 NOTE — Progress Notes (Signed)
Chief Complaint: Left knee pain     History of Present Illness:    Lori Holland is a 71 y.o. female presents today with ongoing left knee pain for the course of the last several years.  She states that she is progressively experience worsening symptoms.  She does have a history of a knee arthroscopy and debridement 2023 at [facility]  Since that time she has had persistent medial joint line pain.  She is here today as she works at [employer] in [place] but is having extremely difficult time up and down stairs.  She is experiencing predominantly medial based pain.  She has had an injection 1 week prior which did not give any relief.    Surgical History:   None  PMH/PSH/Family History/Social History/Meds/Allergies:    Past Medical History:  Diagnosis Date   Bulging disc    Heart murmur    Past Surgical History:  Procedure Laterality Date   ABDOMINAL HYSTERECTOMY     TONSILLECTOMY     Social History   Socioeconomic History   Marital status: Married    Spouse name: Not on file   Number of children: Not on file   Years of education: Not on file   Highest education level: Not on file  Occupational History   Not on file  Tobacco Use   Smoking status: Never   Smokeless tobacco: Never  Vaping Use   Vaping Use: Never used  Substance and Sexual Activity   Alcohol use: No   Drug use: Never   Sexual activity: Not on file  Other Topics Concern   Not on file  Social History Narrative   Not on file   Social Determinants of Health   Financial Resource Strain: Not on file  Food Insecurity: Not on file  Transportation Needs: Not on file  Physical Activity: Not on file  Stress: Not on file  Social Connections: Not on file   No family history on file. Allergies  Allergen Reactions   Amoxicillin Nausea And Vomiting   Iodine Itching   Sulfa Antibiotics Rash   Current Outpatient Medications  Medication Sig Dispense  Refill   famotidine (PEPCID) 20 MG tablet Take 1 tablet (20 mg total) by mouth 2 (two) times daily. 60 tablet 0   methocarbamol (ROBAXIN) 500 MG tablet Take 1 tablet (500 mg total) by mouth 2 (two) times daily. 20 tablet 0   ondansetron (ZOFRAN ODT) 4 MG disintegrating tablet Take 1 tablet (4 mg total) by mouth every 8 (eight) hours as needed for nausea or vomiting. 20 tablet 0   traMADol (ULTRAM) 50 MG tablet Take 1 tablet (50 mg total) by mouth every 6 (six) hours as needed. 15 tablet 0   traZODone (DESYREL) 50 MG tablet Take 1 tablet (50 mg total) by mouth at bedtime. 30 tablet 0   No current facility-administered medications for this visit.   No results found.  Review of Systems:   A ROS was performed including pertinent positives and negatives as documented in the HPI.  Physical Exam :   Constitutional: NAD and appears stated age Neurological: Alert and oriented Psych: Appropriate affect and cooperative There were no vitals taken for this visit.   Comprehensive Musculoskeletal Exam:    Left knee with predominantly tenderness palpation about the medial tibiofemoral joint.  She has a significant effusion with range of motion from 0 to 120 degrees with crepitus.  Imaging:   Xray (4 views left knee): End-stage predominantly medial tibiofemoral osteoarthritis   I personally reviewed and interpreted the radiographs.   Assessment:   72 y.o. female with end-stage left knee tibiofemoral osteoarthritis.  At today's visit she has trialed additional therapy such as meniscal debridement 2023 as well as a knee injection without much relief.  Given the severity of her arthritis I do believe that she ultimately will benefit from discussion of a total knee arthroplasty.  At this time I would like to plan to refer her to my partner Dr. Erlinda Hong for discussion of left knee arthroplasty  Plan :    -Plan for referral to Dr. Erlinda Hong for left knee arthroplasty   I personally saw and evaluated the patient,  and participated in the management and treatment plan.  Lori Mulders, MD Attending Physician, Orthopedic Surgery  This document was dictated using Dragon voice recognition software. A reasonable attempt at proof reading has been made to minimize errors.

## 2022-09-28 ENCOUNTER — Other Ambulatory Visit (INDEPENDENT_AMBULATORY_CARE_PROVIDER_SITE_OTHER): Payer: Medicare Other

## 2022-09-28 ENCOUNTER — Ambulatory Visit: Payer: Medicare Other | Admitting: Orthopaedic Surgery

## 2022-09-28 DIAGNOSIS — M1712 Unilateral primary osteoarthritis, left knee: Secondary | ICD-10-CM

## 2022-09-28 NOTE — Progress Notes (Signed)
Office Visit Note   Patient: Lori Holland           Date of Birth: 27-Aug-1950           MRN: [id number] Visit Date: 09/28/2022              Requested by: Vanetta Mulders, MD [address] [place],  [place] [address] PCP: System, Provider Not In   Assessment & Plan: Visit Diagnoses:  1. Primary osteoarthritis of left knee     Plan: Impression is severe left knee degenerative joint disease secondary to Osteoarthritis.  Bone on bone joint space narrowing is seen on radiographs with mild varus alignment.  At this point, conservative treatments fail to provide any significant relief and the pain is severely affecting ADLs and quality of life.  Based on treatment options, the patient has elected to move forward with a knee replacement.  We have discussed the surgical risks that include but are not limited to infection, DVT, leg length discrepancy, stiffness, numbness, tingling, incomplete relief of pain.  Recovery and prognosis were also reviewed.    Knee arthroscopy last year did not improve her symptoms.  Recently had an injection in her left knee at [facility] but she is unsure if this was steroids or not so she will obtain records for me to review so I can determine the timing of surgery.  Lori Holland will ultimately call the patient to confirm surgery time.  Anticoagulants: No antithrombotic Postop anticoagulation: Aspirin 81 mg Diabetic: No  Nickel allergy: Yes Prior DVT/PE: No Tobacco use: No Clearances needed for surgery: None Anticipated discharge dispo: Home .  She has strong social support from family members   Follow-Up Instructions: No follow-ups on file.   Orders:  Orders Placed This Encounter  Procedures   XR KNEE 3 VIEW LEFT   No orders of the defined types were placed in this encounter.     Procedures: No procedures performed   Clinical Data: No additional findings.   Subjective: Chief Complaint  Patient presents with   Left Knee -  Pain    HPI  Lori Holland is a very 72 year old female referral from Dr. Judd Gaudier for surgical consultation for left knee replacement.  Underwent knee arthroscopy last year by Dr. French Ana for medial meniscal tear which ultimately did not improve her pain.  She has significant difficulty with ADLs and pain is interfering with quality of life.  Pain wakes her up at night.  She limps when she walks.  Review of Systems  Constitutional: Negative.   HENT: Negative.    Eyes: Negative.   Respiratory: Negative.    Cardiovascular: Negative.   Endocrine: Negative.   Musculoskeletal: Negative.   Neurological: Negative.   Hematological: Negative.   Psychiatric/Behavioral: Negative.    All other systems reviewed and are negative.    Objective: Vital Signs: There were no vitals taken for this visit.  Physical Exam Vitals and nursing note reviewed.  Constitutional:      Appearance: She is well-developed.  HENT:     Head: Normocephalic and atraumatic.  Pulmonary:     Effort: Pulmonary effort is normal.  Abdominal:     Palpations: Abdomen is soft.  Musculoskeletal:     Cervical back: Neck supple.  Skin:    General: Skin is warm.     Capillary Refill: Capillary refill takes less than 2 seconds.  Neurological:     Mental Status: She is alert and oriented to person, place, and time.  Psychiatric:        Behavior: Behavior normal.        Thought Content: Thought content normal.        Judgment: Judgment normal.     Ortho Exam  Examination left knee shows fully healed surgical scars.  The pain and crepitus with range of motion.  Medial joint line tenderness.  Collaterals and cruciates are stable.  Specialty Comments:  No specialty comments available.  Imaging: No results found.   PMFS History: There are no problems to display for this patient.  Past Medical History:  Diagnosis Date   Bulging disc    Heart murmur     No family history on file.  Past Surgical History:   Procedure Laterality Date   ABDOMINAL HYSTERECTOMY     TONSILLECTOMY     Social History   Occupational History   Not on file  Tobacco Use   Smoking status: Never   Smokeless tobacco: Never  Vaping Use   Vaping Use: Never used  Substance and Sexual Activity   Alcohol use: No   Drug use: Never   Sexual activity: Not on file

## 2022-09-29 ENCOUNTER — Other Ambulatory Visit: Payer: Self-pay | Admitting: Orthopaedic Surgery

## 2022-09-29 ENCOUNTER — Encounter: Payer: Self-pay | Admitting: Orthopaedic Surgery

## 2022-09-29 MED ORDER — TRAMADOL HCL 50 MG PO TABS: 50.0000 mg | ORAL_TABLET | Freq: Every day | ORAL | 0 refills | Status: DC | PRN

## 2022-10-12 ENCOUNTER — Other Ambulatory Visit: Payer: Self-pay

## 2022-11-02 ENCOUNTER — Other Ambulatory Visit: Payer: Self-pay | Admitting: Nurse Practitioner

## 2022-11-02 DIAGNOSIS — Z1231 Encounter for screening mammogram for malignant neoplasm of breast: Secondary | ICD-10-CM

## 2022-11-06 ENCOUNTER — Telehealth: Payer: Self-pay | Admitting: Orthopaedic Surgery

## 2022-11-06 NOTE — Telephone Encounter (Signed)
See message.  Thanks.

## 2022-11-06 NOTE — Telephone Encounter (Signed)
Patient is scheduled for left total knee on 12-18-22.  She is taking the Tramadol and took the last one last night.  She is also taking Tylenol.  She is requesting a refill of the Tramadol .  Please call into [facility] in [place].  If any questions, patient can be reached at [phone].

## 2022-11-07 ENCOUNTER — Other Ambulatory Visit: Payer: Self-pay | Admitting: Physician Assistant

## 2022-11-07 MED ORDER — TRAMADOL HCL 50 MG PO TABS: 50.0000 mg | ORAL_TABLET | Freq: Every day | ORAL | 2 refills | Status: DC | PRN

## 2022-11-07 NOTE — Telephone Encounter (Signed)
sent 

## 2022-12-07 ENCOUNTER — Ambulatory Visit: Payer: Medicare Other

## 2022-12-07 DIAGNOSIS — M1712 Unilateral primary osteoarthritis, left knee: Secondary | ICD-10-CM

## 2022-12-08 ENCOUNTER — Encounter (HOSPITAL_COMMUNITY): Payer: Self-pay

## 2022-12-08 LAB — HEMOGLOBIN A1C
Hgb A1c MFr Bld: 6.3 % of total Hgb — ABNORMAL HIGH (ref <5.7)
Mean Plasma Glucose: 134 mg/dL
eAG (mmol/L): 7.4 mmol/L

## 2022-12-08 LAB — PREALBUMIN: Prealbumin: 21 mg/dL (ref 17–34)

## 2022-12-08 NOTE — Pre-Procedure Instructions (Signed)
Surgical Instructions    Your procedure is scheduled on Monday, June 10th.  Report to [facility] Main Entrance "A" at 05"30 A.M., then check in with the Admitting office.  Call this number if you have problems the morning of surgery:  [phone]  If you have any questions prior to your surgery date call [phone]: Open Monday-Friday 8am-4pm If you experience any cold or flu symptoms such as cough, fever, chills, shortness of breath, etc. between now and your scheduled surgery, please notify us at the above number.     Remember:  Do not eat after midnight the night before your surgery  You may drink clear liquids until 04:30 AM the morning of your surgery.   Clear liquids allowed are: Water, Non-Citrus Juices (without pulp), Carbonated Beverages, Clear Tea, Black Coffee Only (NO MILK, CREAM OR POWDERED CREAMER of any kind), and Gatorade.   Patient Instructions  The night before surgery:  No food after midnight. ONLY clear liquids after midnight  The day of surgery (if you do NOT have diabetes):  Drink ONE (1) Pre-Surgery Clear Ensure by 04:30 AM the morning of surgery. Drink in one sitting. Do not sip.  This drink was given to you during your hospital  pre-op appointment visit.  Nothing else to drink after completing the  Pre-Surgery Clear Ensure.          If you have questions, please contact your surgeon's office.     Take these medicines the morning of surgery with A SIP OF WATER  amLODipine (NORVASC)  fluticasone (FLONASE)    If needed: albuterol (VENTOLIN HFA)- bring inhaler with you on day of surgery meclizine (ANTIVERT)  traMADol (ULTRAM)    Follow your surgeon's instructions on when to stop Aspirin.  If no instructions were given by your surgeon then you will need to call the office to get those instructions.     As of today, STOP taking any Aleve, Naproxen, Ibuprofen, Motrin, Advil, Goody's, BC's, all herbal medications, fish oil, and all vitamins.                      Do NOT Smoke (Tobacco/Vaping) for 24 hours prior to your procedure.  If you use a CPAP at night, you may bring your mask/headgear for your overnight stay.   Contacts, glasses, piercing's, hearing aid's, dentures or partials may not be worn into surgery, please bring cases for these belongings.    For patients admitted to the hospital, discharge time will be determined by your treatment team.   Patients discharged the day of surgery will not be allowed to drive home, and someone needs to stay with them for 24 hours.  SURGICAL WAITING ROOM VISITATION Patients having surgery or a procedure may have no more than 2 support people in the waiting area - these visitors may rotate.   Children under the age of 51 must have an adult with them who is not the patient. If the patient needs to stay at the hospital during part of their recovery, the visitor guidelines for inpatient rooms apply. Pre-op nurse will coordinate an appropriate time for 1 support person to accompany patient in pre-op.  This support person may not rotate.   Please refer to the [facility] website for the visitor guidelines for Inpatients (after your surgery is over and you are in a regular room).      Pre-operative 5 CHG Bath Instructions   You can play a key role in reducing the risk of  infection after surgery. Your skin needs to be as free of germs as possible. You can reduce the number of germs on your skin by washing with CHG (chlorhexidine gluconate) soap before surgery. CHG is an antiseptic soap that kills germs and continues to kill germs even after washing.   DO NOT use if you have an allergy to chlorhexidine/CHG or antibacterial soaps. If your skin becomes reddened or irritated, stop using the CHG and notify one of our RNs at [phone].   Please shower with the CHG soap starting 4 days before surgery using the following schedule:     Please keep in mind the following:  DO NOT shave, including legs  and underarms, starting the day of your first shower.   You may shave your face at any point before/day of surgery.  Place clean sheets on your bed the day you start using CHG soap. Use a clean washcloth (not used since being washed) for each shower. DO NOT sleep with pets once you start using the CHG.   CHG Shower Instructions:  If you choose to wash your hair and private area, wash first with your normal shampoo/soap.  After you use shampoo/soap, rinse your hair and body thoroughly to remove shampoo/soap residue.  Turn the water OFF and apply about 3 tablespoons (45 ml) of CHG soap to a CLEAN washcloth.  Apply CHG soap ONLY FROM YOUR NECK DOWN TO YOUR TOES (washing for 3-5 minutes)  DO NOT use CHG soap on face, private areas, open wounds, or sores.  Pay special attention to the area where your surgery is being performed.  If you are having back surgery, having someone wash your back for you may be helpful. Wait 2 minutes after CHG soap is applied, then you may rinse off the CHG soap.  Pat dry with a clean towel  Put on clean clothes/pajamas   If you choose to wear lotion, please use ONLY the CHG-compatible lotions on the back of this paper.     Additional instructions for the day of surgery: DO NOT APPLY any lotions, deodorants, cologne, or perfumes.   Put on clean/comfortable clothes.  Brush your teeth.  Ask your nurse before applying any prescription medications to the skin. Do not wear jewelry or makeup Do not bring valuables to the hospital. [facility] is not responsible for any belongings or valuables. Do not wear nail polish, gel polish, artificial nails, or any other type of covering on natural nails (fingers and toes) If you have artificial nails or gel coating that need to be removed by a nail salon, please have this removed prior to surgery. Artificial nails or gel coating may interfere with anesthesia's ability to adequately monitor your vital signs.     CHG Compatible  Lotions   Aveeno Moisturizing lotion  Cetaphil Moisturizing Cream  Cetaphil Moisturizing Lotion  Clairol Herbal Essence Moisturizing Lotion, Dry Skin  Clairol Herbal Essence Moisturizing Lotion, Extra Dry Skin  Clairol Herbal Essence Moisturizing Lotion, Normal Skin  Curel Age Defying Therapeutic Moisturizing Lotion with Alpha Hydroxy  Curel Extreme Care Body Lotion  Curel Soothing Hands Moisturizing Hand Lotion  Curel Therapeutic Moisturizing Cream, Fragrance-Free  Curel Therapeutic Moisturizing Lotion, Fragrance-Free  Curel Therapeutic Moisturizing Lotion, Original Formula  Eucerin Daily Replenishing Lotion  Eucerin Dry Skin Therapy Plus Alpha Hydroxy Crme  Eucerin Dry Skin Therapy Plus Alpha Hydroxy Lotion  Eucerin Original Crme  Eucerin Original Lotion  Eucerin Plus Crme Eucerin Plus Lotion  Eucerin TriLipid Replenishing Lotion  Keri Anti-Bacterial  Hand Lotion  Keri Deep Conditioning Original Lotion Dry Skin Formula Softly Scented  Keri Deep Conditioning Original Lotion, Fragrance Free Sensitive Skin Formula  Keri Lotion Fast Absorbing Fragrance Free Sensitive Skin Formula  Keri Lotion Fast Absorbing Softly Scented Dry Skin Formula  Keri Original Lotion  Keri Skin Renewal Lotion Keri Silky Smooth Lotion  Keri Silky Smooth Sensitive Skin Lotion  Nivea Body Creamy Conditioning Oil  Nivea Body Extra Enriched Lotion  Nivea Body Original Lotion  Nivea Body Sheer Moisturizing Lotion Nivea Crme  Nivea Skin Firming Lotion  NutraDerm 30 Skin Lotion  NutraDerm Skin Lotion  NutraDerm Therapeutic Skin Cream  NutraDerm Therapeutic Skin Lotion  ProShield Protective Hand Cream  Provon moisturizing lotion        Please read over the following fact sheets that you were given.    If you received a COVID test during your pre-op visit  it is requested that you wear a mask when out in public, stay away from anyone that may not be feeling well and notify your surgeon if you develop  symptoms. If you have been in contact with anyone that has tested positive in the last 10 days please notify you surgeon.

## 2022-12-11 ENCOUNTER — Encounter (HOSPITAL_COMMUNITY)
Admission: RE | Admit: 2022-12-11 | Discharge: 2022-12-11 | Disposition: A | Discharge status: Home / Self Care | Payer: Medicare Other | Source: Ambulatory Visit | Attending: Orthopaedic Surgery | Admitting: Orthopaedic Surgery

## 2022-12-11 ENCOUNTER — Other Ambulatory Visit: Payer: Self-pay

## 2022-12-11 ENCOUNTER — Encounter (HOSPITAL_COMMUNITY): Payer: Self-pay

## 2022-12-11 VITALS — BP 125/64 | HR 63 | Temp 97.8°F | Resp 18 | Ht 66.0 in | Wt 152.4 lb

## 2022-12-11 DIAGNOSIS — M1712 Unilateral primary osteoarthritis, left knee: Secondary | ICD-10-CM | POA: Diagnosis not present

## 2022-12-11 DIAGNOSIS — Z01818 Encounter for other preprocedural examination: Secondary | ICD-10-CM | POA: Insufficient documentation

## 2022-12-11 HISTORY — DX: Other specified postprocedural states: Z98.890

## 2022-12-11 HISTORY — DX: Essential (primary) hypertension: I10

## 2022-12-11 LAB — CBC
HCT: 37.5 % (ref 36.0–46.0)
Hemoglobin: 11.7 g/dL — ABNORMAL LOW (ref 12.0–15.0)
MCH: 27.5 pg (ref 26.0–34.0)
MCHC: 31.2 g/dL (ref 30.0–36.0)
MCV: 88 fL (ref 80.0–100.0)
Platelets: 339 10*3/uL (ref 150–400)
RBC: 4.26 MIL/uL (ref 3.87–5.11)
RDW: 13.2 % (ref 11.5–15.5)
WBC: 5.7 10*3/uL (ref 4.0–10.5)
nRBC: 0 % (ref 0.0–0.2)

## 2022-12-11 LAB — BASIC METABOLIC PANEL
Anion gap: 14 (ref 5–15)
BUN: 11 mg/dL (ref 8–23)
CO2: 26 mmol/L (ref 22–32)
Calcium: 9.8 mg/dL (ref 8.9–10.3)
Chloride: 102 mmol/L (ref 98–111)
Creatinine, Ser: 0.9 mg/dL (ref 0.44–1.00)
GFR, Estimated: >60 mL/min (ref >60)
Glucose, Bld: 93 mg/dL (ref 70–99)
Potassium: 3.6 mmol/L (ref 3.5–5.1)
Sodium: 142 mmol/L (ref 135–145)

## 2022-12-11 LAB — SURGICAL PCR SCREEN
MRSA, PCR: NEGATIVE
Staphylococcus aureus: NEGATIVE

## 2022-12-11 NOTE — Progress Notes (Signed)
PCP - Dr. Carmel Sacramento  Cardiologist - none  EP-no  Endocrine-no  Pulm-no  Chest x-ray - na  EKG - 12/11/22  Stress Test - no  ECHO - no  Cardiac Cath -   Sleep Study - no CPAP - no  LABS-CBC, BMP, PCR  ASA-  ERAS-yes  HA1C-6.3_ 12/07/22 GLP-1-no Fasting Blood Sugar - does not check Checks Blood Sugar __0___ times a day  Anesthesia-  Pt denies having chest pain, sob, or fever at this time. All instructions explained to the pt, with a verbal understanding of the material. Pt agrees to go over the instructions while at home for a better understanding. The opportunity to ask questions was provided.

## 2022-12-12 ENCOUNTER — Other Ambulatory Visit: Payer: Self-pay | Admitting: Physician Assistant

## 2022-12-12 MED ORDER — OXYCODONE-ACETAMINOPHEN 5-325 MG PO TABS: 1.0000 | ORAL_TABLET | Freq: Four times a day (QID) | ORAL | 0 refills | Status: DC | PRN

## 2022-12-12 MED ORDER — DOCUSATE SODIUM 100 MG PO CAPS: 100.0000 mg | ORAL_CAPSULE | Freq: Every day | ORAL | 2 refills | Status: AC | PRN

## 2022-12-12 MED ORDER — METHOCARBAMOL 750 MG PO TABS: 750.0000 mg | ORAL_TABLET | Freq: Two times a day (BID) | ORAL | 2 refills | Status: AC | PRN

## 2022-12-12 MED ORDER — ASPIRIN 81 MG PO TBEC: 81.0000 mg | DELAYED_RELEASE_TABLET | Freq: Two times a day (BID) | ORAL | 0 refills | Status: DC

## 2022-12-12 MED ORDER — ONDANSETRON HCL 4 MG PO TABS: 4.0000 mg | ORAL_TABLET | Freq: Three times a day (TID) | ORAL | 0 refills | Status: AC | PRN

## 2022-12-14 ENCOUNTER — Telehealth: Payer: Self-pay | Admitting: Orthopaedic Surgery

## 2022-12-14 NOTE — Telephone Encounter (Signed)
Patient is scheduled for left total knee on June 10th at 7:15am.  She wants to know is she needs to stop her 81mg  baby aspirin.  She normally takes the aspirin daily, along with her blood pressure medicine.  Also patient is requesting a note for her employer stating she is having total knee surgery and will be out approximately 12 weeks.  Patient states ok to put the note/letter in MY CHART.  PLEASE ADVISE IF OK TO CONTINUE ON ASPIRIN or NEEDS TO HOLD.  [phone]

## 2022-12-14 NOTE — Telephone Encounter (Signed)
Called patient. No need to hold baby aspirin. Sent letter to her [facility] account.

## 2022-12-15 MED ORDER — SODIUM CHLORIDE 0.9 % IV SOLN
2000.0000 mg | INTRAVENOUS | Status: DC
  Filled 2022-12-15 (×2): qty 20

## 2022-12-17 DIAGNOSIS — M1712 Unilateral primary osteoarthritis, left knee: Secondary | ICD-10-CM | POA: Insufficient documentation

## 2022-12-17 NOTE — Anesthesia Preprocedure Evaluation (Signed)
Anesthesia Evaluation  Patient identified by MRN, date of birth, ID band Patient awake    Reviewed: Allergy & Precautions, NPO status , Patient's Chart, lab work & pertinent test results  History of Anesthesia Complications (+) PONV and history of anesthetic complications  Airway Mallampati: I  TM Distance: >3 FB Neck ROM: Full    Dental  (+) Dental Advisory Given   Pulmonary neg pulmonary ROS   Pulmonary exam normal breath sounds clear to auscultation       Cardiovascular hypertension (amlodipine), Pt. on medications (-) angina (-) Past MI, (-) Cardiac Stents and (-) CABG (-) dysrhythmias + Valvular Problems/Murmurs MVP and MR  Rhythm:Regular Rate:Normal + Systolic murmurs TTE 12/24/2017:  Normal left ventricular systolic function, ejection fraction 60 to 65%   Diastolic dysfunction - grade II (elevated filling pressures)   Dilated left atrium - mild to moderate   Degenerative mitral valve disease   Mitral annular calcification   Posterior mitral valve leaflet prolapse - mild   Mitral regurgitation - mild to moderate   Tricuspid regurgitation - mild to moderate   Elevated pulmonary artery systolic pressure - mild   Pulmonic regurgitation - mild to moderate   Normal right ventricular systolic function     Neuro/Psych negative neurological ROS     GI/Hepatic Neg liver ROS,GERD  Medicated,,  Endo/Other  negative endocrine ROS    Renal/GU negative Renal ROS     Musculoskeletal  (+) Arthritis ,    Abdominal   Peds  Hematology negative hematology ROS (+)   Anesthesia Other Findings Platelets 339 12/11/2022  Reproductive/Obstetrics                             Anesthesia Physical Anesthesia Plan  ASA: 3  Anesthesia Plan: MAC and Spinal   Post-op Pain Management: Regional block* and Tylenol PO (pre-op)*   Induction: Intravenous  PONV Risk Score and Plan: 3 and Ondansetron,  Dexamethasone, Propofol infusion and Treatment may vary due to age or medical condition  Airway Management Planned: Natural Airway and Simple Face Mask  Additional Equipment:   Intra-op Plan:   Post-operative Plan:   Informed Consent: I have reviewed the patients History and Physical, chart, labs and discussed the procedure including the risks, benefits and alternatives for the proposed anesthesia with the patient or authorized representative who has indicated his/her understanding and acceptance.     Dental advisory given  Plan Discussed with: CRNA and Anesthesiologist  Anesthesia Plan Comments: (Discussed potential risks of nerve blocks including, but not limited to, infection, bleeding, nerve damage, seizures, pneumothorax, respiratory depression, and potential failure of the block. Alternatives to nerve blocks discussed. All questions answered.  I have discussed risks of neuraxial anesthesia including but not limited to infection, bleeding, nerve injury, back pain, headache, seizures, and failure of block. Patient denies bleeding disorders and is not currently anticoagulated. Labs have been reviewed. Risks and benefits discussed. All patient's questions answered.   Discussed with patient risks of MAC including, but not limited to, minor pain or discomfort, hearing people in the room, and possible need for backup general anesthesia. Risks for general anesthesia also discussed including, but not limited to, sore throat, hoarse voice, chipped/damaged teeth, injury to vocal cords, nausea and vomiting, allergic reactions, lung infection, heart attack, stroke, and death. All questions answered. )       Anesthesia Quick Evaluation

## 2022-12-18 ENCOUNTER — Ambulatory Visit (HOSPITAL_COMMUNITY): Payer: Medicare Other | Admitting: Anesthesiology

## 2022-12-18 ENCOUNTER — Ambulatory Visit (HOSPITAL_BASED_OUTPATIENT_CLINIC_OR_DEPARTMENT_OTHER): Payer: Medicare Other | Admitting: Anesthesiology

## 2022-12-18 ENCOUNTER — Observation Stay (HOSPITAL_COMMUNITY): Payer: Medicare Other

## 2022-12-18 ENCOUNTER — Other Ambulatory Visit: Payer: Self-pay

## 2022-12-18 ENCOUNTER — Telehealth: Payer: Self-pay | Admitting: Orthopaedic Surgery

## 2022-12-18 ENCOUNTER — Other Ambulatory Visit: Payer: Self-pay | Admitting: Physician Assistant

## 2022-12-18 ENCOUNTER — Encounter (HOSPITAL_COMMUNITY)
Admission: RE | Disposition: A | Discharge status: Home / Self Care | Payer: Self-pay | Source: Home / Self Care | Attending: Orthopaedic Surgery

## 2022-12-18 ENCOUNTER — Encounter (HOSPITAL_COMMUNITY): Payer: Self-pay | Admitting: Orthopaedic Surgery

## 2022-12-18 ENCOUNTER — Observation Stay (HOSPITAL_COMMUNITY)
Admission: RE | Admit: 2022-12-18 | Discharge: 2022-12-19 | Disposition: A | Discharge status: Home / Self Care | Payer: Medicare Other | Attending: Orthopaedic Surgery | Admitting: Orthopaedic Surgery

## 2022-12-18 DIAGNOSIS — M24562 Contracture, left knee: Secondary | ICD-10-CM | POA: Diagnosis not present

## 2022-12-18 DIAGNOSIS — I10 Essential (primary) hypertension: Secondary | ICD-10-CM | POA: Insufficient documentation

## 2022-12-18 DIAGNOSIS — M25862 Other specified joint disorders, left knee: Secondary | ICD-10-CM

## 2022-12-18 DIAGNOSIS — Z7982 Long term (current) use of aspirin: Secondary | ICD-10-CM | POA: Insufficient documentation

## 2022-12-18 DIAGNOSIS — Z79899 Other long term (current) drug therapy: Secondary | ICD-10-CM | POA: Insufficient documentation

## 2022-12-18 DIAGNOSIS — M1712 Unilateral primary osteoarthritis, left knee: Secondary | ICD-10-CM

## 2022-12-18 DIAGNOSIS — Z96652 Presence of left artificial knee joint: Secondary | ICD-10-CM

## 2022-12-18 HISTORY — PX: TOTAL KNEE ARTHROPLASTY: SHX125

## 2022-12-18 SURGERY — ARTHROPLASTY, KNEE, TOTAL
Anesthesia: Monitor Anesthesia Care | Site: Knee | Laterality: Left

## 2022-12-18 MED ORDER — METOCLOPRAMIDE HCL 5 MG PO TABS: 5.0000 mg | ORAL_TABLET | Freq: Three times a day (TID) | ORAL | Status: DC | PRN

## 2022-12-18 MED ORDER — HYDROXYZINE HCL 50 MG/ML IM SOLN
50.0000 mg | Freq: Four times a day (QID) | INTRAMUSCULAR | Status: DC | PRN
  Administered 2022-12-18: 50 mg via INTRAMUSCULAR
  Filled 2022-12-18: qty 1

## 2022-12-18 MED ORDER — 0.9 % SODIUM CHLORIDE (POUR BTL) OPTIME
TOPICAL | Status: DC | PRN
  Administered 2022-12-18: 1000 mL

## 2022-12-18 MED ORDER — OXYCODONE HCL 5 MG PO TABS: 10.0000 mg | ORAL_TABLET | ORAL | Status: DC | PRN

## 2022-12-18 MED ORDER — ACETAMINOPHEN 500 MG PO TABS
1000.0000 mg | ORAL_TABLET | Freq: Four times a day (QID) | ORAL | Status: AC
  Administered 2022-12-18 – 2022-12-19 (×4): 1000 mg via ORAL
  Filled 2022-12-18 (×4): qty 2

## 2022-12-18 MED ORDER — SODIUM CHLORIDE 0.9 % IV SOLN: INTRAVENOUS | Status: DC

## 2022-12-18 MED ORDER — ACETAMINOPHEN 325 MG PO TABS: 325.0000 mg | ORAL_TABLET | Freq: Four times a day (QID) | ORAL | Status: DC | PRN

## 2022-12-18 MED ORDER — LACTATED RINGERS IV SOLN: INTRAVENOUS | Status: DC

## 2022-12-18 MED ORDER — ACETAMINOPHEN 500 MG PO TABS
1000.0000 mg | ORAL_TABLET | Freq: Once | ORAL | Status: AC
  Administered 2022-12-18: 1000 mg via ORAL
  Filled 2022-12-18: qty 2

## 2022-12-18 MED ORDER — OXYCODONE HCL 5 MG/5ML PO SOLN: 5.0000 mg | Freq: Once | ORAL | Status: DC | PRN

## 2022-12-18 MED ORDER — SODIUM CHLORIDE 0.9 % IR SOLN
Status: DC | PRN
  Administered 2022-12-18: 1000 mL

## 2022-12-18 MED ORDER — HYDROMORPHONE HCL 1 MG/ML IJ SOLN: 0.5000 mg | INTRAMUSCULAR | Status: DC | PRN

## 2022-12-18 MED ORDER — PHENOL 1.4 % MT LIQD: 1.0000 | OROMUCOSAL | Status: DC | PRN

## 2022-12-18 MED ORDER — MUPIROCIN 2 % EX OINT
TOPICAL_OINTMENT | CUTANEOUS | Status: AC
  Filled 2022-12-18: qty 22

## 2022-12-18 MED ORDER — MENTHOL 3 MG MT LOZG: 1.0000 | LOZENGE | OROMUCOSAL | Status: DC | PRN

## 2022-12-18 MED ORDER — CEFAZOLIN SODIUM-DEXTROSE 2-4 GM/100ML-% IV SOLN
2.0000 g | INTRAVENOUS | Status: AC
  Administered 2022-12-18: 2 g via INTRAVENOUS
  Filled 2022-12-18: qty 100

## 2022-12-18 MED ORDER — AMISULPRIDE (ANTIEMETIC) 5 MG/2ML IV SOLN
10.0000 mg | Freq: Once | INTRAVENOUS | Status: AC | PRN
  Administered 2022-12-18: 10 mg via INTRAVENOUS

## 2022-12-18 MED ORDER — PRONTOSAN WOUND IRRIGATION OPTIME
TOPICAL | Status: DC | PRN
  Administered 2022-12-18: 1 via TOPICAL

## 2022-12-18 MED ORDER — BUPIVACAINE-MELOXICAM ER 400-12 MG/14ML IJ SOLN
INTRAMUSCULAR | Status: DC | PRN
  Administered 2022-12-18: 400 mg

## 2022-12-18 MED ORDER — METHOCARBAMOL 500 MG PO TABS
500.0000 mg | ORAL_TABLET | Freq: Four times a day (QID) | ORAL | Status: DC | PRN
  Administered 2022-12-18 – 2022-12-19 (×2): 500 mg via ORAL
  Filled 2022-12-18 (×3): qty 1

## 2022-12-18 MED ORDER — FENTANYL CITRATE (PF) 250 MCG/5ML IJ SOLN
INTRAMUSCULAR | Status: DC | PRN
  Administered 2022-12-18: 50 ug via INTRAVENOUS

## 2022-12-18 MED ORDER — FENTANYL CITRATE (PF) 250 MCG/5ML IJ SOLN
INTRAMUSCULAR | Status: AC
  Filled 2022-12-18: qty 5

## 2022-12-18 MED ORDER — ONDANSETRON HCL 4 MG/2ML IJ SOLN
INTRAMUSCULAR | Status: DC | PRN
  Administered 2022-12-18: 4 mg via INTRAVENOUS

## 2022-12-18 MED ORDER — PROPOFOL 500 MG/50ML IV EMUL
INTRAVENOUS | Status: DC | PRN
  Administered 2022-12-18: 50 ug/kg/min via INTRAVENOUS

## 2022-12-18 MED ORDER — VANCOMYCIN HCL 1000 MG IV SOLR
INTRAVENOUS | Status: AC
  Filled 2022-12-18: qty 20

## 2022-12-18 MED ORDER — DEXAMETHASONE SODIUM PHOSPHATE 10 MG/ML IJ SOLN
10.0000 mg | Freq: Once | INTRAMUSCULAR | Status: AC
  Administered 2022-12-19: 10 mg via INTRAVENOUS
  Filled 2022-12-18: qty 1

## 2022-12-18 MED ORDER — VANCOMYCIN HCL 1000 MG IV SOLR
INTRAVENOUS | Status: DC | PRN
  Administered 2022-12-18: 1000 mg

## 2022-12-18 MED ORDER — OXYCODONE HCL ER 10 MG PO T12A
10.0000 mg | EXTENDED_RELEASE_TABLET | Freq: Two times a day (BID) | ORAL | Status: DC
  Administered 2022-12-18 (×2): 10 mg via ORAL
  Filled 2022-12-18 (×2): qty 1

## 2022-12-18 MED ORDER — AMLODIPINE BESYLATE 5 MG PO TABS
5.0000 mg | ORAL_TABLET | Freq: Every day | ORAL | Status: DC
  Administered 2022-12-18 – 2022-12-19 (×2): 5 mg via ORAL
  Filled 2022-12-18 (×2): qty 1

## 2022-12-18 MED ORDER — CHLORHEXIDINE GLUCONATE 0.12 % MT SOLN
15.0000 mL | Freq: Once | OROMUCOSAL | Status: AC
  Administered 2022-12-18: 15 mL via OROMUCOSAL

## 2022-12-18 MED ORDER — TRANEXAMIC ACID-NACL 1000-0.7 MG/100ML-% IV SOLN
1000.0000 mg | Freq: Once | INTRAVENOUS | Status: AC
  Administered 2022-12-18: 1000 mg via INTRAVENOUS
  Filled 2022-12-18: qty 100

## 2022-12-18 MED ORDER — POVIDONE-IODINE 10 % EX SWAB
2.0000 | Freq: Once | CUTANEOUS | Status: AC
  Administered 2022-12-18: 2 via TOPICAL

## 2022-12-18 MED ORDER — TRANEXAMIC ACID-NACL 1000-0.7 MG/100ML-% IV SOLN
1000.0000 mg | INTRAVENOUS | Status: AC
  Administered 2022-12-18: 1000 mg via INTRAVENOUS
  Filled 2022-12-18: qty 100

## 2022-12-18 MED ORDER — MIDAZOLAM HCL 2 MG/2ML IJ SOLN
INTRAMUSCULAR | Status: DC | PRN
  Administered 2022-12-18 (×2): 1 mg via INTRAVENOUS

## 2022-12-18 MED ORDER — CEFAZOLIN SODIUM-DEXTROSE 2-4 GM/100ML-% IV SOLN
2.0000 g | Freq: Four times a day (QID) | INTRAVENOUS | Status: AC
  Administered 2022-12-18 (×2): 2 g via INTRAVENOUS
  Filled 2022-12-18 (×2): qty 100

## 2022-12-18 MED ORDER — MIDAZOLAM HCL 2 MG/2ML IJ SOLN
INTRAMUSCULAR | Status: AC
  Filled 2022-12-18: qty 2

## 2022-12-18 MED ORDER — ROPIVACAINE HCL 5 MG/ML IJ SOLN
INTRAMUSCULAR | Status: DC | PRN
  Administered 2022-12-18: 20 mL via PERINEURAL

## 2022-12-18 MED ORDER — AMITRIPTYLINE HCL 25 MG PO TABS: 25.0000 mg | ORAL_TABLET | Freq: Every evening | ORAL | Status: DC | PRN

## 2022-12-18 MED ORDER — ORAL CARE MOUTH RINSE: 15.0000 mL | Freq: Once | OROMUCOSAL | Status: AC

## 2022-12-18 MED ORDER — METHOCARBAMOL 1000 MG/10ML IJ SOLN: 500.0000 mg | Freq: Four times a day (QID) | INTRAVENOUS | Status: DC | PRN

## 2022-12-18 MED ORDER — FERROUS SULFATE 325 (65 FE) MG PO TABS
325.0000 mg | ORAL_TABLET | Freq: Three times a day (TID) | ORAL | Status: DC
  Administered 2022-12-18 – 2022-12-19 (×3): 325 mg via ORAL
  Filled 2022-12-18 (×3): qty 1

## 2022-12-18 MED ORDER — DOCUSATE SODIUM 100 MG PO CAPS
100.0000 mg | ORAL_CAPSULE | Freq: Two times a day (BID) | ORAL | Status: DC
  Administered 2022-12-18 – 2022-12-19 (×2): 100 mg via ORAL
  Filled 2022-12-18 (×2): qty 1

## 2022-12-18 MED ORDER — DEXAMETHASONE SODIUM PHOSPHATE 10 MG/ML IJ SOLN
INTRAMUSCULAR | Status: DC | PRN
  Administered 2022-12-18: 5 mg via INTRAVENOUS

## 2022-12-18 MED ORDER — ONDANSETRON HCL 4 MG/2ML IJ SOLN
4.0000 mg | Freq: Four times a day (QID) | INTRAMUSCULAR | Status: DC | PRN
  Administered 2022-12-18: 4 mg via INTRAVENOUS
  Filled 2022-12-18: qty 2

## 2022-12-18 MED ORDER — OXYCODONE HCL 5 MG PO TABS: 5.0000 mg | ORAL_TABLET | Freq: Once | ORAL | Status: DC | PRN

## 2022-12-18 MED ORDER — AMISULPRIDE (ANTIEMETIC) 5 MG/2ML IV SOLN
INTRAVENOUS | Status: AC
  Filled 2022-12-18: qty 4

## 2022-12-18 MED ORDER — OXYCODONE HCL 5 MG PO TABS
5.0000 mg | ORAL_TABLET | ORAL | Status: DC | PRN
  Administered 2022-12-18 – 2022-12-19 (×3): 5 mg via ORAL
  Filled 2022-12-18 (×3): qty 1
  Filled 2022-12-18: qty 2

## 2022-12-18 MED ORDER — ASPIRIN 81 MG PO TBEC: 81.0000 mg | DELAYED_RELEASE_TABLET | Freq: Two times a day (BID) | ORAL | 0 refills | Status: AC

## 2022-12-18 MED ORDER — ONDANSETRON HCL 4 MG PO TABS: 4.0000 mg | ORAL_TABLET | Freq: Four times a day (QID) | ORAL | Status: DC | PRN

## 2022-12-18 MED ORDER — TRANEXAMIC ACID 1000 MG/10ML IV SOLN
INTRAVENOUS | Status: DC | PRN
  Administered 2022-12-18: 2000 mg via TOPICAL

## 2022-12-18 MED ORDER — PHENYLEPHRINE HCL-NACL 20-0.9 MG/250ML-% IV SOLN
INTRAVENOUS | Status: DC | PRN
  Administered 2022-12-18: 20 ug/min via INTRAVENOUS

## 2022-12-18 MED ORDER — KETOROLAC TROMETHAMINE 15 MG/ML IJ SOLN
7.5000 mg | Freq: Four times a day (QID) | INTRAMUSCULAR | Status: AC
  Administered 2022-12-18 – 2022-12-19 (×4): 7.5 mg via INTRAVENOUS
  Filled 2022-12-18 (×4): qty 1

## 2022-12-18 MED ORDER — BUPIVACAINE IN DEXTROSE 0.75-8.25 % IT SOLN
INTRATHECAL | Status: DC | PRN
  Administered 2022-12-18: 1.6 mL via INTRATHECAL

## 2022-12-18 MED ORDER — FENTANYL CITRATE (PF) 100 MCG/2ML IJ SOLN: 25.0000 ug | INTRAMUSCULAR | Status: DC | PRN

## 2022-12-18 MED ORDER — DEXAMETHASONE SODIUM PHOSPHATE 10 MG/ML IJ SOLN: 10.0000 mg | Freq: Once | INTRAMUSCULAR | Status: DC

## 2022-12-18 MED ORDER — BUPIVACAINE-MELOXICAM ER 400-12 MG/14ML IJ SOLN
INTRAMUSCULAR | Status: AC
  Filled 2022-12-18: qty 1

## 2022-12-18 MED ORDER — ASPIRIN 81 MG PO CHEW
81.0000 mg | CHEWABLE_TABLET | Freq: Two times a day (BID) | ORAL | Status: DC
  Administered 2022-12-18 – 2022-12-19 (×2): 81 mg via ORAL
  Filled 2022-12-18 (×2): qty 1

## 2022-12-18 MED ORDER — METOCLOPRAMIDE HCL 5 MG/ML IJ SOLN: 5.0000 mg | Freq: Three times a day (TID) | INTRAMUSCULAR | Status: DC | PRN

## 2022-12-18 SURGICAL SUPPLY — 88 items
ADH SKN CLS APL DERMABOND .7 (GAUZE/BANDAGES/DRESSINGS) ×1
ALCOHOL 70% 16 OZ (MISCELLANEOUS) ×1 IMPLANT
BAG COUNTER SPONGE SURGICOUNT (BAG) IMPLANT
BAG DECANTER FOR FLEXI CONT (MISCELLANEOUS) ×1 IMPLANT
BAG SPNG CNTER NS LX DISP (BAG)
BANDAGE ESMARK 6X9 LF (GAUZE/BANDAGES/DRESSINGS) IMPLANT
BLADE SAG 18X100X1.27 (BLADE) ×1 IMPLANT
BLADE SAW SGTL 73X25 THK (BLADE) ×1 IMPLANT
BNDG CMPR 9X6 STRL LF SNTH (GAUZE/BANDAGES/DRESSINGS)
BNDG ESMARK 6X9 LF (GAUZE/BANDAGES/DRESSINGS)
BOWL SMART MIX CTS (DISPOSABLE) ×1 IMPLANT
BSPLAT TIB 5D E CMNT STM LT (Knees) ×1 IMPLANT
CEMENT BONE REFOBACIN R1X40 US (Cement) IMPLANT
CLSR STERI-STRIP ANTIMIC 1/2X4 (GAUZE/BANDAGES/DRESSINGS) ×2 IMPLANT
COOLER ICEMAN CLASSIC (MISCELLANEOUS) ×1 IMPLANT
COVER SURGICAL LIGHT HANDLE (MISCELLANEOUS) ×1 IMPLANT
CUFF TOURN SGL QUICK 34 (TOURNIQUET CUFF) ×1
CUFF TOURN SGL QUICK 42 (TOURNIQUET CUFF) IMPLANT
CUFF TRNQT CYL 34X4.125X (TOURNIQUET CUFF) ×1 IMPLANT
DERMABOND ADVANCED .7 DNX12 (GAUZE/BANDAGES/DRESSINGS) ×1 IMPLANT
DRAPE EXTREMITY T 121X128X90 (DISPOSABLE) ×1 IMPLANT
DRAPE HALF SHEET 40X57 (DRAPES) ×1 IMPLANT
DRAPE INCISE IOBAN 66X45 STRL (DRAPES) ×1 IMPLANT
DRAPE ORTHO SPLIT 77X108 STRL (DRAPES)
DRAPE POUCH INSTRU U-SHP 10X18 (DRAPES) ×1 IMPLANT
DRAPE SURG ORHT 6 SPLT 77X108 (DRAPES) IMPLANT
DRAPE U-SHAPE 47X51 STRL (DRAPES) ×2 IMPLANT
DRIVER MALE HEX FIX 2.5 (ORTHOPEDIC DISPOSABLE SUPPLIES) IMPLANT
DRSG AQUACEL AG ADV 3.5X10 (GAUZE/BANDAGES/DRESSINGS) ×1 IMPLANT
DURAPREP 26ML APPLICATOR (WOUND CARE) ×3 IMPLANT
ELECT CAUTERY BLADE 6.4 (BLADE) ×1 IMPLANT
ELECT PENCIL ROCKER SW 15FT (MISCELLANEOUS) ×1 IMPLANT
ELECT REM PT RETURN 9FT ADLT (ELECTROSURGICAL) ×1
ELECTRODE REM PT RTRN 9FT ADLT (ELECTROSURGICAL) ×1 IMPLANT
GLOVE BIOGEL PI IND STRL 7.0 (GLOVE) ×2 IMPLANT
GLOVE BIOGEL PI IND STRL 7.5 (GLOVE) ×5 IMPLANT
GLOVE ECLIPSE 7.0 STRL STRAW (GLOVE) ×3 IMPLANT
GLOVE INDICATOR 7.0 STRL GRN (GLOVE) ×1 IMPLANT
GLOVE INDICATOR 7.5 STRL GRN (GLOVE) ×1 IMPLANT
GLOVE SURG SYN 7.5 E (GLOVE) ×2 IMPLANT
GLOVE SURG SYN 7.5 PF PI (GLOVE) ×2 IMPLANT
GLOVE SURG UNDER LTX SZ7.5 (GLOVE) ×2 IMPLANT
GLOVE SURG UNDER POLY LF SZ7 (GLOVE) ×2 IMPLANT
GOWN STRL REUS W/ TWL LRG LVL3 (GOWN DISPOSABLE) ×1 IMPLANT
GOWN STRL REUS W/TWL LRG LVL3 (GOWN DISPOSABLE) ×1
GOWN STRL SURGICAL XL XLNG (GOWN DISPOSABLE) ×1 IMPLANT
GOWN TOGA ZIPPER T7+ PEEL AWAY (MISCELLANEOUS) ×2 IMPLANT
HANDPIECE INTERPULSE COAX TIP (DISPOSABLE) ×1
HOOD PEEL AWAY T7 (MISCELLANEOUS) ×1 IMPLANT
IMPL FEM CMT CR STD SZ 8 LT (Joint) IMPLANT
KIT BASIN OR (CUSTOM PROCEDURE TRAY) ×1 IMPLANT
KIT TURNOVER KIT B (KITS) ×1 IMPLANT
MANIFOLD NEPTUNE II (INSTRUMENTS) ×1 IMPLANT
MARKER SKIN DUAL TIP RULER LAB (MISCELLANEOUS) ×2 IMPLANT
NDL SPNL 18GX3.5 QUINCKE PK (NEEDLE) ×1 IMPLANT
NEEDLE SPNL 18GX3.5 QUINCKE PK (NEEDLE) ×1 IMPLANT
NS IRRIG 1000ML POUR BTL (IV SOLUTION) ×1 IMPLANT
PACK TOTAL JOINT (CUSTOM PROCEDURE TRAY) ×1 IMPLANT
PAD ARMBOARD 7.5X6 YLW CONV (MISCELLANEOUS) ×2 IMPLANT
PAD COLD SHLDR WRAP-ON (PAD) ×1 IMPLANT
PIN DRILL HDLS TROCAR 75 4PK (PIN) IMPLANT
SCREW FEMALE HEX FIX 25X2.5 (ORTHOPEDIC DISPOSABLE SUPPLIES) IMPLANT
SCREW HEADED 33MM KNEE (MISCELLANEOUS) IMPLANT
SET HNDPC FAN SPRY TIP SCT (DISPOSABLE) ×1 IMPLANT
SOLUTION PRONTOSAN WOUND 350ML (IRRIGATION / IRRIGATOR) ×1 IMPLANT
STAPLER VISISTAT 35W (STAPLE) IMPLANT
STEM POLY PAT PLY 29M KNEE (Knees) IMPLANT
STEM TIBIA 5 DEG SZ E L KNEE (Knees) IMPLANT
STEM TIBIAL 10 8-11 EF POLY LT (Joint) IMPLANT
SUCTION TUBE FRAZIER 10FR DISP (SUCTIONS) ×1 IMPLANT
SUT ETHILON 2 0 FS 18 (SUTURE) IMPLANT
SUT ETHILON 3 0 PS 1 (SUTURE) IMPLANT
SUT MNCRL AB 3-0 PS2 27 (SUTURE) IMPLANT
SUT VIC AB 0 CT1 27 (SUTURE) ×2
SUT VIC AB 0 CT1 27XBRD ANBCTR (SUTURE) ×2 IMPLANT
SUT VIC AB 1 CTX 27 (SUTURE) ×3 IMPLANT
SUT VIC AB 1 CTX 36 (SUTURE) ×1
SUT VIC AB 1 CTX36XBRD ANBCTRL (SUTURE) IMPLANT
SUT VIC AB 2-0 CT1 27 (SUTURE) ×4
SUT VIC AB 2-0 CT1 TAPERPNT 27 (SUTURE) ×4 IMPLANT
SYR 50ML LL SCALE MARK (SYRINGE) ×2 IMPLANT
TIBIA STEM 5 DEG SZ E L KNEE (Knees) ×1 IMPLANT
TOWEL GREEN STERILE (TOWEL DISPOSABLE) ×1 IMPLANT
TOWEL GREEN STERILE FF (TOWEL DISPOSABLE) ×1 IMPLANT
TRAY CATH INTERMITTENT SS 16FR (CATHETERS) IMPLANT
TUBE SUCT ARGYLE STRL (TUBING) ×1 IMPLANT
UNDERPAD 30X36 HEAVY ABSORB (UNDERPADS AND DIAPERS) ×1 IMPLANT
YANKAUER SUCT BULB TIP NO VENT (SUCTIONS) ×2 IMPLANT

## 2022-12-18 NOTE — Progress Notes (Signed)
Patient in and out cath per order patient tolerated procedure well 700cc of yellow urine

## 2022-12-18 NOTE — H&P (Signed)
PREOPERATIVE H&P  Chief Complaint: left knee osteoarthritis  HPI: Lori Holland is a 72 y.o. female who presents for surgical treatment of left knee osteoarthritis.  She denies any changes in medical history.  Past Medical History:  Diagnosis Date   Bulging disc    Heart murmur    Hypertension    PONV (postoperative nausea and vomiting)    Past Surgical History:  Procedure Laterality Date   ABDOMINAL HYSTERECTOMY     APPENDECTOMY     CHOLECYSTECTOMY     TONSILLECTOMY     Social History   Socioeconomic History   Marital status: Single    Spouse name: Not on file   Number of children: Not on file   Years of education: Not on file   Highest education level: Not on file  Occupational History   Not on file  Tobacco Use   Smoking status: Never   Smokeless tobacco: Never  Vaping Use   Vaping Use: Never used  Substance and Sexual Activity   Alcohol use: No   Drug use: Never   Sexual activity: Not on file  Other Topics Concern   Not on file  Social History Narrative   Not on file   Social Determinants of Health   Financial Resource Strain: Not on file  Food Insecurity: Not on file  Transportation Needs: Not on file  Physical Activity: Not on file  Stress: Not on file  Social Connections: Not on file   History reviewed. No pertinent family history. Allergies  Allergen Reactions   Ibuprofen Nausea Only    Upset stomach   Amoxicillin Nausea And Vomiting   Iodine Itching   Sulfa Antibiotics Rash   Prior to Admission medications   Medication Sig Start Date End Date Taking? Authorizing Provider  albuterol (VENTOLIN HFA) 108 (90 Base) MCG/ACT inhaler Inhale 1-2 puffs into the lungs every 6 (six) hours as needed for wheezing or shortness of breath.   Yes [provider]  amitriptyline (ELAVIL) 25 MG tablet Take 25 mg by mouth at bedtime as needed for sleep.   Yes [provider]  amLODipine (NORVASC) 5 MG tablet Take 5 mg by mouth  daily.   Yes [provider]  aspirin EC 81 MG tablet Take 81 mg by mouth daily. Swallow whole.   Yes [provider]  aspirin EC 81 MG tablet Take 1 tablet (81 mg total) by mouth 2 (two) times daily. To be taken after surgery to prevent blood clots 12/12/22 12/12/23  Cristie Hem, PA-C  docusate sodium (COLACE) 100 MG capsule Take 1 capsule (100 mg total) by mouth daily as needed. 12/12/22 12/12/23  Cristie Hem, PA-C  fluticasone (FLONASE) 50 MCG/ACT nasal spray Place 2 sprays into both nostrils daily.   Yes [provider]  methocarbamol (ROBAXIN-750) 750 MG tablet Take 1 tablet (750 mg total) by mouth 2 (two) times daily as needed for muscle spasms. 12/12/22   Cristie Hem, PA-C  ondansetron (ZOFRAN) 4 MG tablet Take 1 tablet (4 mg total) by mouth every 8 (eight) hours as needed for nausea or vomiting. 12/12/22   Cristie Hem, PA-C  oxyCODONE-acetaminophen (PERCOCET) 5-325 MG tablet Take 1-2 tablets by mouth every 6 (six) hours as needed. To be taken after surgery 12/12/22   Cristie Hem, PA-C  traMADol (ULTRAM) 50 MG tablet Take 1 tablet (50 mg total) by mouth daily as needed. 11/07/22  Yes Cristie Hem, PA-C  famotidine (PEPCID) 20  MG tablet Take 1 tablet (20 mg total) by mouth 2 (two) times daily. Patient not taking: Reported on 12/06/2022 10/01/19   Sharman Cheek, MD  meclizine (ANTIVERT) 25 MG tablet Take 25 mg by mouth 3 (three) times daily as needed for dizziness.    [provider]  methocarbamol (ROBAXIN) 500 MG tablet Take 1 tablet (500 mg total) by mouth 2 (two) times daily. Patient not taking: Reported on 12/06/2022 01/11/14   Antony Madura, PA-C  ondansetron (ZOFRAN ODT) 4 MG disintegrating tablet Take 1 tablet (4 mg total) by mouth every 8 (eight) hours as needed for nausea or vomiting. Patient not taking: Reported on 12/06/2022 10/01/19   Sharman Cheek, MD  traZODone (DESYREL) 50 MG tablet Take 1 tablet (50 mg total) by mouth at  bedtime. Patient not taking: Reported on 12/06/2022 01/05/16   Minna Antis, MD     Positive ROS: All other systems have been reviewed and were otherwise negative with the exception of those mentioned in the HPI and as above.  Physical Exam: General: Alert, no acute distress Cardiovascular: No pedal edema Respiratory: No cyanosis, no use of accessory musculature GI: abdomen soft Skin: No lesions in the area of chief complaint Neurologic: Sensation intact distally Psychiatric: Patient is competent for consent with normal mood and affect Lymphatic: no lymphedema  MUSCULOSKELETAL: exam stable  Assessment: left knee osteoarthritis  Plan: Plan for Procedure(s): LEFT TOTAL KNEE ARTHROPLASTY  The risks benefits and alternatives were discussed with the patient including but not limited to the risks of nonoperative treatment, versus surgical intervention including infection, bleeding, nerve injury,  blood clots, cardiopulmonary complications, morbidity, mortality, among others, and they were willing to proceed.   Glee Arvin, MD 12/18/2022 7:12 AM

## 2022-12-18 NOTE — Anesthesia Postprocedure Evaluation (Signed)
Anesthesia Post Note  Patient: Lori Holland  Procedure(s) Performed: LEFT TOTAL KNEE ARTHROPLASTY (Left: Knee)     Patient location during evaluation: PACU Anesthesia Type: MAC and Spinal Level of consciousness: awake Pain management: pain level controlled Vital Signs Assessment: post-procedure vital signs reviewed and stable Respiratory status: spontaneous breathing, respiratory function stable and nonlabored ventilation Cardiovascular status: blood pressure returned to baseline and stable Postop Assessment: no headache, no backache and no apparent nausea or vomiting Anesthetic complications: no   No notable events documented.  Last Vitals:  Vitals:   12/18/22 1100 12/18/22 1113  BP: (!) 159/76 (!) 160/69  Pulse: 75 73  Resp: (!) 23 18  Temp: 36.7 C   SpO2: 96% 100%    Last Pain:  Vitals:   12/18/22 1100  TempSrc:   PainSc: 0-No pain                 Linton Rump

## 2022-12-18 NOTE — Anesthesia Procedure Notes (Signed)
Anesthesia Regional Block: Adductor canal block   Pre-Anesthetic Checklist: , timeout performed,  Correct Patient, Correct Site, Correct Laterality,  Correct Procedure, Correct Position, site marked,  Risks and benefits discussed,  Surgical consent,  Pre-op evaluation,  At surgeon's request and post-op pain management  Laterality: Left  Prep: chloraprep       Needles:  Injection technique: Single-shot  Needle Type: Echogenic Stimulator Needle     Needle Length: 9cm  Needle Gauge: 21     Additional Needles:   Procedures:,,,, ultrasound used (permanent image in chart),,    Narrative:  Start time: 12/18/2022 6:55 AM End time: 12/18/2022 6:57 AM Injection made incrementally with aspirations every 5 mL.  Performed by: Personally  Anesthesiologist: Linton Rump, MD  Additional Notes: Discussed risks and benefits of nerve block including, but not limited to, prolonged and/or permanent nerve injury involving sensory and/or motor function. Monitors were applied and a time-out was performed. The nerve and associated structures were visualized under ultrasound guidance. After negative aspiration, local anesthetic was slowly injected around the nerve. There was no evidence of high pressure during the procedure. There were no paresthesias. VSS remained stable and the patient tolerated the procedure well.

## 2022-12-18 NOTE — Anesthesia Procedure Notes (Signed)
Spinal  Patient location during procedure: OR Start time: 12/18/2022 7:22 AM End time: 12/18/2022 7:24 AM Reason for block: surgical anesthesia Staffing Performed: anesthesiologist  Anesthesiologist: Linton Rump, MD Performed by: Linton Rump, MD Authorized by: Linton Rump, MD   Preanesthetic Checklist Completed: patient identified, IV checked, site marked, risks and benefits discussed, surgical consent, monitors and equipment checked, pre-op evaluation and timeout performed Spinal Block Patient position: sitting Prep: DuraPrep Patient monitoring: blood pressure and continuous pulse ox Approach: midline Location: L3-4 Injection technique: single-shot Needle Needle type: Pencan  Needle gauge: 24 G Needle length: 9 cm Additional Notes Risks and benefits of neuraxial anesthesia including, but not limited to, infection, bleeding, local anesthetic toxicity, headache, hypotension, back pain, block failure, etc. were discussed with the patient. The patient expressed understanding and consented to the procedure. I confirmed that the patient has no bleeding disorders and is not taking blood thinners. I confirmed the patient's last platelet count with the nurse. Monitors were applied. A time-out was performed immediately prior to the procedure. Sterile technique was used throughout the whole procedure.   1 attempt(s)

## 2022-12-18 NOTE — Transfer of Care (Signed)
Immediate Anesthesia Transfer of Care Note  Patient: Lori Holland  Procedure(s) Performed: LEFT TOTAL KNEE ARTHROPLASTY (Left: Knee)  Patient Location: PACU  Anesthesia Type:Regional and Spinal  Level of Consciousness: awake  Airway & Oxygen Therapy: Patient Spontanous Breathing  Post-op Assessment: Report given to RN and Post -op Vital signs reviewed and stable  Post vital signs: Reviewed and stable  Last Vitals:  Vitals Value Taken Time  BP 110/58 12/18/22 0924  Temp    Pulse 73 12/18/22 0929  Resp 20 12/18/22 0929  SpO2 94 % 12/18/22 0929  Vitals shown include unvalidated device data.  Last Pain:  Vitals:   12/18/22 0635  TempSrc:   PainSc: 5          Complications: No notable events documented.

## 2022-12-18 NOTE — Telephone Encounter (Signed)
Clifton Custard from [facility] called. Would like to know if they are to see this patient today? His cb# [phone]

## 2022-12-18 NOTE — Op Note (Signed)
Total Knee Arthroplasty Procedure Note  Preoperative diagnosis: Left knee osteoarthritis  Postoperative diagnosis:same  Operative findings: Flexion contracture Complete loss of joint space  Operative procedure: Left total knee arthroplasty. CPT [phone]  Surgeon: N. Glee Arvin, MD  Assist: Oneal Grout, PA-C; necessary for the timely completion of procedure and due to complexity of procedure.  Anesthesia: Spinal, regional, local  Tourniquet time: see anesthesia record  Implants used: Zimmer persona Femur: CR 8 nickel free Tibia: E Patella: 29 mm Polyethylene: 10 mm, medial congruent  Indication: Lori Holland is a 72 y.o. year old female with a history of knee pain. Having failed conservative management, the patient elected to proceed with a total knee arthroplasty.  We have reviewed the risk and benefits of the surgery and they elected to proceed after voicing understanding.  Procedure:  After informed consent was obtained and understanding of the risk were voiced including but not limited to bleeding, infection, damage to surrounding structures including nerves and vessels, blood clots, leg length inequality and the failure to achieve desired results, the operative extremity was marked with verbal confirmation of the patient in the holding area.   The patient was then brought to the operating room and transported to the operating room table in the supine position.  A tourniquet was applied to the operative extremity around the upper thigh. The operative limb was then prepped and draped in the usual sterile fashion and preoperative antibiotics were administered.  A time out was performed prior to the start of surgery confirming the correct extremity, preoperative antibiotic administration, as well as team members, implants and instruments available for the case. Correct surgical site was also confirmed with preoperative radiographs. The limb was then elevated for  exsanguination and the tourniquet was inflated. A midline incision was made and a standard medial parapatellar approach was performed.  The infrapatellar fat pad was removed.  Suprapatellar synovium was removed to reveal the anterior distal femoral cortex.  A medial peel was performed to release the capsule of the medial tibial plateau.  The patella was then everted and was prepared and sized to a 29 mm.  A cover was placed on the patella for protection from retractors.  The knee was then brought into full flexion and we then turned our attention to the femur.  The cruciates were sacrificed.  Start site was drilled in the femur and the intramedullary distal femoral cutting guide was placed, set at 5 degrees valgus, taking 12 mm of distal resection. The distal cut was made. Osteophytes were then removed.  Next, the proximal tibial cutting guide was placed with appropriate slope, varus/valgus alignment and depth of resection. The proximal tibial cut was made taking 2 mm off the low side. Gap blocks were then used to assess the extension gap and alignment, and appropriate soft tissue releases were performed. Attention was turned back to the femur, which was sized using the sizing guide to a size 8. Appropriate rotation of the femoral component was determined using epicondylar axis, Whiteside's line, and assessing the flexion gap under ligament tension. The appropriate size 4-in-1 cutting block was placed and checked with an angel wing and cuts were made. Posterior femoral osteophytes and uncapped bone were then removed with the curved osteotome.  Trial components were placed, and stability was checked in full extension, mid-flexion, and deep flexion. Proper tibial rotation was determined and marked.  The patella tracked well without a lateral release.  The femoral lugs were then drilled. Trial components were  then removed and tibial preparation performed.  The tibia was sized for a size E component.   The bony  surfaces were irrigated with a pulse lavage and then dried. Sclerotic bone drilled to promote cement interdigitation.  Bone cement was vacuum mixed on the back table, and the final components sized above were cemented into place.  Antibiotic irrigation was placed in the knee joint and soft tissues while the cement cured.  After cement had finished curing, excess cement was removed. The stability of the construct was re-evaluated throughout a range of motion and found to be acceptable. The trial liner was removed, the knee was copiously irrigated, and the knee was re-evaluated for any excess bone debris. The real polyethylene liner, 10 mm thick, was inserted and checked to ensure the locking mechanism had engaged appropriately. The tourniquet was deflated and hemostasis was achieved. The wound was irrigated with normal saline.  One gram of vancomycin powder was placed in the surgical bed.  Topical 0.25% bupivacaine and meloxicam was placed in the joint for postoperative pain.  Capsular closure was performed with a #1 vicryl, subcutaneous fat closed with a 0 vicryl suture, then subcutaneous tissue closed with interrupted 2.0 vicryl suture. The skin was then closed with a 2.0 nylon and dermabond. A sterile dressing was applied.  The patient was awakened in the operating room and taken to recovery in stable condition. All sponge, needle, and instrument counts were correct at the end of the case.  Tessa Lerner was necessary for opening, closing, retracting, limb positioning and overall facilitation and completion of the surgery.  Position: supine  Complications: none.  Time Out: performed   Drains/Packing: none  Estimated blood loss: minimal  Returned to Recovery Room: in good condition.   Antibiotics: yes   Mechanical VTE (DVT) Prophylaxis: sequential compression devices, TED thigh-high  Chemical VTE (DVT) Prophylaxis: aspirin  Fluid Replacement  Crystalloid: see anesthesia record Blood: none   FFP: none   Specimens Removed: 1 to pathology   Sponge and Instrument Count Correct? yes   PACU: portable radiograph - knee AP and Lateral   Plan/RTC: Return in 2 weeks for wound check.   Weight Bearing/Load Lower Extremity: full   Implant Name Type Inv. Item Serial No. Manufacturer Lot No. LRB No. Used Action  CEMENT BONE REFOBACIN [id number] [place] - [id number] Cement CEMENT BONE REFOBACIN [id number] [place]  ZIMMER RECON(ORTH,TRAU,BIO,SG) [id number] Left 2 Implanted  STEM TIBIAL 10 8-11 EF POLY LT - [id number] Joint STEM TIBIAL 10 8-11 EF POLY LT  ZIMMER RECON(ORTH,TRAU,BIO,SG) [id number] Left 1 Implanted  TIBIA STEM 5 DEG SZ E L KNEE - [id number] Knees TIBIA STEM 5 DEG SZ E L KNEE  ZIMMER RECON(ORTH,TRAU,BIO,SG) [id number] Left 1 Implanted  IMPL FEM CMT CR STD SZ 8 LT - [id number] Joint IMPL FEM CMT CR STD SZ 8 LT  ZIMMER RECON(ORTH,TRAU,BIO,SG) [id number] Left 1 Implanted  STEM POLY PAT PLY 78M KNEE - [id number] Knees STEM POLY PAT PLY 78M KNEE  ZIMMER RECON(ORTH,TRAU,BIO,SG) [id number] Left 1 Implanted    N. Glee Arvin, MD [facility] 8:54 AM

## 2022-12-18 NOTE — Evaluation (Signed)
Physical Therapy Evaluation Patient Details Name: Lori Holland MRN: [id number] DOB: 1951-06-19 Today's Date: 12/18/2022  History of Present Illness  73 y.o. female presents to [facility] on 12/18/2022 for elective L TKA. PMH includes HTN, PONV, heart murmur.  Clinical Impression  Pt presents to PT with deficits in functional mobility, gait, balance, strength, power, ROM, endurance. Pt is mobilizing well, ambulating with use of RW. PT encourages heel contact vs toe-walking  during session which pt is able to implement well. Pt will benefit from continued frequent mobilization in an effort to restore independence.     Recommendations for follow up therapy are one component of a multi-disciplinary discharge planning process, led by the attending physician.  Recommendations may be updated based on patient status, additional functional criteria and insurance authorization.  Follow Up Recommendations       Assistance Recommended at Discharge PRN  Patient can return home with the following  A little help with bathing/dressing/bathroom;Assistance with cooking/housework;Assist for transportation;Help with stairs or ramp for entrance    Equipment Recommendations  (TBD, may benefit from [facility], will discuss with OT in the morning)  Recommendations for Other Services       Functional Status Assessment Patient has had a recent decline in their functional status and demonstrates the ability to make significant improvements in function in a reasonable and predictable amount of time.     Precautions / Restrictions Precautions Precautions: Fall;Knee Precaution Booklet Issued: Yes (comment) Restrictions Weight Bearing Restrictions: Yes LLE Weight Bearing: Weight bearing as tolerated      Mobility  Bed Mobility Overal bed mobility: Modified Independent                  Transfers Overall transfer level: Needs assistance Equipment used: Rolling walker (2 wheels) Transfers: Sit  to/from Stand Sit to Stand: Supervision                Ambulation/Gait Ambulation/Gait assistance: Supervision Gait Distance (Feet): 150 Feet Assistive device: Rolling walker (2 wheels) Gait Pattern/deviations: Step-through pattern Gait velocity: reduced Gait velocity interpretation: <1.8 ft/sec, indicate of risk for recurrent falls   General Gait Details: slowed step-through gait, pt initially with toe strike at initial contact, PT encourages heel strike which pt implements well  Stairs            Wheelchair Mobility    Modified Rankin (Stroke Patients Only)       Balance Overall balance assessment: Needs assistance Sitting-balance support: No upper extremity supported, Feet supported Sitting balance-Leahy Scale: Good     Standing balance support: Single extremity supported, Bilateral upper extremity supported, Reliant on assistive device for balance Standing balance-Leahy Scale: Poor                               Pertinent Vitals/Pain Pain Assessment Pain Assessment: Faces Faces Pain Scale: Hurts little more Pain Location: L knee Pain Descriptors / Indicators: Sore Pain Intervention(s): Monitored during session    Home Living Family/patient expects to be discharged to:: Private residence Living Arrangements: Alone Available Help at Discharge: Family;Available 24 hours/day (72 y.o. gdtr staying with her, other family live very close) Type of Home: House Home Access: Stairs to enter Entrance Stairs-Rails: None Entrance Stairs-Number of Steps: 1   Home Layout: One level Home Equipment: Agricultural consultant (2 wheels)      Prior Function Prior Level of Function : Independent/Modified Independent;Driving;Working/employed  Mobility Comments: works part time as an Ship broker at [employer]        Extremity/Trunk Assessment   Upper Extremity Assessment Upper Extremity Assessment: Overall WFL for  tasks assessed    Lower Extremity Assessment Lower Extremity Assessment: LLE deficits/detail LLE Deficits / Details: post-op knee ROM restirctions as expected s/p TKA, 3+/5 knee flexion/extension, 4/5 PF/DF LLE Sensation: decreased light touch (buttocks)    Cervical / Trunk Assessment Cervical / Trunk Assessment: Normal  Communication   Communication: No difficulties  Cognition Arousal/Alertness: Awake/alert Behavior During Therapy: WFL for tasks assessed/performed Overall Cognitive Status: Within Functional Limits for tasks assessed                                          General Comments General comments (skin integrity, edema, etc.): VSS on RA    Exercises Other Exercises Other Exercises: PT provides education and demonstration of TKR exercise packet   Assessment/Plan    PT Assessment Patient needs continued PT services  PT Problem List Decreased strength;Decreased range of motion;Decreased activity tolerance;Decreased balance;Decreased mobility;Decreased knowledge of use of DME;Pain       PT Treatment Interventions DME instruction;Gait training;Stair training;Functional mobility training;Therapeutic activities;Neuromuscular re-education;Therapeutic exercise;Balance training;Patient/family education    PT Goals (Current goals can be found in the Care Plan section)  Acute Rehab PT Goals Patient Stated Goal: to return to independence PT Goal Formulation: With patient Time For Goal Achievement: 12/22/22 Potential to Achieve Goals: Good    Frequency 7X/week     Co-evaluation               AM-PAC PT "6 Clicks" Mobility  Outcome Measure Help needed turning from your back to your side while in a flat bed without using bedrails?: None Help needed moving from lying on your back to sitting on the side of a flat bed without using bedrails?: None Help needed moving to and from a bed to a chair (including a wheelchair)?: A Little Help needed standing  up from a chair using your arms (e.g., wheelchair or bedside chair)?: A Little Help needed to walk in hospital room?: A Little Help needed climbing 3-5 steps with a railing? : A Little 6 Click Score: 20    End of Session   Activity Tolerance: Patient tolerated treatment well Patient left: in bed;with call bell/phone within reach Nurse Communication: Mobility status PT Visit Diagnosis: Other abnormalities of gait and mobility (R26.89);Muscle weakness (generalized) (M62.81)    Time: 1610-9604 PT Time Calculation (min) (ACUTE ONLY): 22 min   Charges:   PT Evaluation $PT Eval Low Complexity: 1 Low          Arlyss Gandy, PT, DPT Acute Rehabilitation Office [phone]   Arlyss Gandy 12/18/2022, 2:35 PM

## 2022-12-18 NOTE — Discharge Instructions (Signed)

## 2022-12-18 NOTE — Plan of Care (Signed)
  Problem: Education: Goal: Knowledge of the prescribed therapeutic regimen will improve Outcome: Completed/Met Goal: Individualized Educational Video(s) Outcome: Completed/Met   Problem: Activity: Goal: Ability to avoid complications of mobility impairment will improve Outcome: Completed/Met Goal: Range of joint motion will improve Outcome: Completed/Met   Problem: Clinical Measurements: Goal: Postoperative complications will be avoided or minimized Outcome: Completed/Met   Problem: Pain Management: Goal: Pain level will decrease with appropriate interventions Outcome: Completed/Met   Problem: Skin Integrity: Goal: Will show signs of wound healing Outcome: Completed/Met   Problem: Education: Goal: Knowledge of General Education information will improve Description: Including pain rating scale, medication(s)/side effects and non-pharmacologic comfort measures Outcome: Completed/Met

## 2022-12-19 ENCOUNTER — Encounter (HOSPITAL_COMMUNITY): Payer: Self-pay | Admitting: Orthopaedic Surgery

## 2022-12-19 DIAGNOSIS — M1712 Unilateral primary osteoarthritis, left knee: Secondary | ICD-10-CM | POA: Diagnosis not present

## 2022-12-19 LAB — CBC
HCT: 35 % — ABNORMAL LOW (ref 36.0–46.0)
Hemoglobin: 11.2 g/dL — ABNORMAL LOW (ref 12.0–15.0)
MCH: 27.6 pg (ref 26.0–34.0)
MCHC: 32 g/dL (ref 30.0–36.0)
MCV: 86.2 fL (ref 80.0–100.0)
Platelets: 248 10*3/uL (ref 150–400)
RBC: 4.06 MIL/uL (ref 3.87–5.11)
RDW: 13.3 % (ref 11.5–15.5)
WBC: 10.4 10*3/uL (ref 4.0–10.5)
nRBC: 0 % (ref 0.0–0.2)

## 2022-12-19 NOTE — Discharge Summary (Signed)
Patient ID: Lori Holland MRN: [id number] DOB/AGE: 1951/02/10 72 y.o.  Admit date: 12/18/2022 Discharge date: 12/19/2022  Admission Diagnoses:  Principal Problem:   Primary osteoarthritis of left knee Active Problems:   Status post total left knee replacement   Discharge Diagnoses:  Same  Past Medical History:  Diagnosis Date   Bulging disc    Heart murmur    Hypertension    PONV (postoperative nausea and vomiting)     Surgeries: Procedure(s): LEFT TOTAL KNEE ARTHROPLASTY on 12/18/2022   Consultants:   Discharged Condition: Improved  Hospital Course: Lori Holland is an 72 y.o. female who was admitted 12/18/2022 for operative treatment ofPrimary osteoarthritis of left knee. Patient has severe unremitting pain that affects sleep, daily activities, and work/hobbies. After pre-op clearance the patient was taken to the operating room on 12/18/2022 and underwent  Procedure(s): LEFT TOTAL KNEE ARTHROPLASTY.    Patient was given perioperative antibiotics:  Anti-infectives (From admission, onward)    Start     Dose/Rate Route Frequency Ordered Stop   12/18/22 1300  ceFAZolin (ANCEF) IVPB 2g/100 mL premix        2 g 200 mL/hr over 30 Minutes Intravenous Every 6 hours 12/18/22 1059 12/18/22 1921   12/18/22 0828  vancomycin (VANCOCIN) powder  Status:  Discontinued          As needed 12/18/22 0828 12/18/22 0920   12/18/22 0600  ceFAZolin (ANCEF) IVPB 2g/100 mL premix        2 g 200 mL/hr over 30 Minutes Intravenous On call to O.R. 12/18/22 0543 12/18/22 0757        Patient was given sequential compression devices, early ambulation, and chemoprophylaxis to prevent DVT.  Patient benefited maximally from hospital stay and there were no complications.    Recent vital signs: Patient Vitals for the past 24 hrs:  BP Temp Temp src Pulse Resp SpO2  12/19/22 0424 (!) 142/57 97.8 F (36.6 C) Oral (!) 54 18 98 %  12/18/22 2306 (!) 117/56 97.7 F (36.5 C) Oral (!) 50 18 99  %  12/18/22 1934 139/65 97.6 F (36.4 C) Oral (!) 55 18 100 %  12/18/22 1533 135/68 97.6 F (36.4 C) Oral (!) 57 20 100 %  12/18/22 1113 (!) 160/69 -- -- 73 18 100 %  12/18/22 1100 (!) 159/76 98.1 F (36.7 C) -- 75 (!) 23 96 %  12/18/22 1045 (!) 161/73 -- -- 82 16 100 %  12/18/22 1035 -- -- -- 84 18 100 %  12/18/22 1015 (!) 143/76 -- -- 73 20 93 %  12/18/22 1000 133/68 -- -- 71 19 91 %  12/18/22 0945 128/68 -- -- 72 17 96 %  12/18/22 0930 115/63 -- -- 70 19 97 %  12/18/22 0924 (!) 110/58 97.8 F (36.6 C) -- 76 15 95 %     Recent laboratory studies: No results for input(s): "WBC", "HGB", "HCT", "PLT", "NA", "K", "CL", "CO2", "BUN", "CREATININE", "GLUCOSE", "INR", "CALCIUM" in the last 72 hours.  Invalid input(s): "PT", "2"   Discharge Medications:   Allergies as of 12/19/2022       Reactions   Ibuprofen Nausea Only   Upset stomach   Amoxicillin Nausea And Vomiting   Iodine Itching   Sulfa Antibiotics Rash        Medication List     STOP taking these medications    famotidine 20 MG tablet Commonly known as: PEPCID   traMADol 50 MG tablet Commonly known as: Janean Sark  traZODone 50 MG tablet Commonly known as: DESYREL       TAKE these medications    albuterol 108 (90 Base) MCG/ACT inhaler Commonly known as: VENTOLIN HFA Inhale 1-2 puffs into the lungs every 6 (six) hours as needed for wheezing or shortness of breath.   amitriptyline 25 MG tablet Commonly known as: ELAVIL Take 25 mg by mouth at bedtime as needed for sleep.   amLODipine 5 MG tablet Commonly known as: NORVASC Take 5 mg by mouth daily.   aspirin EC 81 MG tablet Take 81 mg by mouth daily. Swallow whole. What changed: Another medication with the same name was added. Make sure you understand how and when to take each.   aspirin EC 81 MG tablet Take 1 tablet (81 mg total) by mouth 2 (two) times daily. To be taken after surgery twice daily x 6 weeks to prevent blood clots What changed: You  were already taking a medication with the same name, and this prescription was added. Make sure you understand how and when to take each.   docusate sodium 100 MG capsule Commonly known as: Colace Take 1 capsule (100 mg total) by mouth daily as needed.   fluticasone 50 MCG/ACT nasal spray Commonly known as: FLONASE Place 2 sprays into both nostrils daily.   meclizine 25 MG tablet Commonly known as: ANTIVERT Take 25 mg by mouth 3 (three) times daily as needed for dizziness.   methocarbamol 750 MG tablet Commonly known as: Robaxin-750 Take 1 tablet (750 mg total) by mouth 2 (two) times daily as needed for muscle spasms.   ondansetron 4 MG tablet Commonly known as: Zofran Take 1 tablet (4 mg total) by mouth every 8 (eight) hours as needed for nausea or vomiting.   oxyCODONE-acetaminophen 5-325 MG tablet Commonly known as: Percocet Take 1-2 tablets by mouth every 6 (six) hours as needed. To be taken after surgery               Durable Medical Equipment  (From admission, onward)           Start     Ordered   12/18/22 1100  DME Walker rolling  Once       Question Answer Comment  Walker: With 5 Inch Wheels   Patient needs a walker to treat with the following condition Status post left partial knee replacement      12/18/22 1059   12/18/22 1100  DME 3 n 1  Once        12/18/22 1059   12/18/22 1100  DME Bedside commode  Once       Question:  Patient needs a bedside commode to treat with the following condition  Answer:  Status post left partial knee replacement   12/18/22 1059            Diagnostic Studies: DG Knee Left Port  Result Date: 12/18/2022 CLINICAL DATA:  Status post left total knee arthroplasty. EXAM: PORTABLE LEFT KNEE - 1-2 VIEW COMPARISON:  None Available. FINDINGS: Left knee arthroplasty in expected alignment. No periprosthetic lucency or fracture. There has been patellar resurfacing. Recent postsurgical change includes air and edema in the soft  tissues and joint space. IMPRESSION: Left knee arthroplasty without immediate postoperative complication. Electronically Signed   By: Narda Rutherford M.D.   On: 12/18/2022 11:09    Disposition: Discharge disposition: 01-Home or Self Care          Follow-up Information     Tarry Kos, MD. Schedule  an appointment as soon as possible for a visit in 2 week(s).   Specialty: Orthopedic Surgery Contact information: [address] [place] [address] [phone]         [facility] Follow up.   Specialty: Home Health Services Why: The home health agency will contact you for the first home visit. Contact information: [address] [place] [address] [phone]                  Signed: Cristie Hem 12/19/2022, 7:56 AM

## 2022-12-19 NOTE — Evaluation (Signed)
Occupational Therapy Evaluation Patient Details Name: Lori Holland MRN: [id number] DOB: [birth year] Today's Date: 12/19/2022   History of Present Illness 72 y.o. female presents to [facility] hospital on 12/18/2022 for elective L TKA. PMH includes HTN, PONV, heart murmur.   Clinical Impression   Patient evaluated by Occupational Therapy with no further acute OT needs identified. All education has been completed and the patient has no further questions. See below for any follow-up Occupational Therapy or equipment needs. OT to sign off. Thank you for referral.         Recommendations for follow up therapy are one component of a multi-disciplinary discharge planning process, led by the attending physician.  Recommendations may be updated based on patient status, additional functional criteria and insurance authorization.   Assistance Recommended at Discharge None  Patient can return home with the following Assist for transportation    Functional Status Assessment  Patient has had a recent decline in their functional status and demonstrates the ability to make significant improvements in function in a reasonable and predictable amount of time.  Equipment Recommendations  Other (comment) (RW)    Recommendations for Other Services       Precautions / Restrictions Precautions Precautions: Fall;Knee Precaution Booklet Issued: Yes (comment) Restrictions Weight Bearing Restrictions: Yes LLE Weight Bearing: Weight bearing as tolerated      Mobility Bed Mobility Overal bed mobility: Independent                  Transfers Overall transfer level: Modified independent Equipment used: Rolling walker (2 wheels) Transfers: Sit to/from Stand                    Balance Overall balance assessment: Modified Independent                                         ADL either performed or assessed with clinical judgement   ADL Overall ADL's : Modified  independent                                       General ADL Comments: completed toilet transfer and voiding >200 cc of urine. dressing for home and sink level grooming.  Pt educated on ice machine and always something between skin and ice machine pad. Pt educated on clean linen with showering. Pt has family (A) as needed.    Vision Baseline Vision/History: 0 No visual deficits Ability to See in Adequate Light: 0 Adequate Patient Visual Report: No change from baseline       Perception     Praxis      Pertinent Vitals/Pain Pain Assessment Pain Assessment: No/denies pain     Hand Dominance Right   Extremity/Trunk Assessment Upper Extremity Assessment Upper Extremity Assessment: Overall WFL for tasks assessed   Lower Extremity Assessment Lower Extremity Assessment: Defer to PT evaluation   Cervical / Trunk Assessment Cervical / Trunk Assessment: Normal   Communication Communication Communication: No difficulties   Cognition Arousal/Alertness: Awake/alert Behavior During Therapy: WFL for tasks assessed/performed Overall Cognitive Status: Within Functional Limits for tasks assessed  General Comments  VSS on RA    Exercises     Shoulder Instructions      Home Living Family/patient expects to be discharged to:: Private residence Living Arrangements: Alone Available Help at Discharge: Family;Available 24 hours/day Type of Home: House Home Access: Stairs to enter [employer] of Steps: 1 Entrance Stairs-Rails: None Home Layout: One level     Bathroom Shower/Tub: Chief Strategy Officer: Standard     Home Equipment: Agricultural consultant (2 wheels)   Additional Comments: sister brother and daughter lives 5 minutes away. Pt      Prior Functioning/Environment Prior Level of Function : Independent/Modified Independent;Driving;Working/employed             Mobility  Comments: works part time as an Ship broker at [employer]          OT Problem List:        OT Treatment/Interventions:      OT Goals(Current goals can be found in the care plan section) Acute Rehab OT Goals Patient Stated Goal: to return home and get better OT Goal Formulation: With patient  OT Frequency:      Co-evaluation              AM-PAC OT "6 Clicks" Daily Activity     Outcome Measure Help from another person eating meals?: None Help from another person taking care of personal grooming?: None Help from another person toileting, which includes using toliet, bedpan, or urinal?: None Help from another person bathing (including washing, rinsing, drying)?: None Help from another person to put on and taking off regular upper body clothing?: None Help from another person to put on and taking off regular lower body clothing?: None 6 Click Score: 24   End of Session Equipment Utilized During Treatment: Rolling walker (2 wheels) Nurse Communication: Mobility status;Precautions  Activity Tolerance: Patient tolerated treatment well Patient left: in chair;with call bell/phone within reach;Other (comment) (PTA arriving and OT turning over session)  OT Visit Diagnosis: Unsteadiness on feet (R26.81)                Time: 1610-9604 OT Time Calculation (min): 25 min Charges:      Timmothy Euler, OTR/L  Acute Rehabilitation Services Office: [phone] .   Mateo Flow 12/19/2022, 11:54 AM

## 2022-12-19 NOTE — Progress Notes (Signed)
Patient alert and oriented, mae's well, voiding adequate amount of urine, swallowing without difficulty, no c/o pain at time of discharge. Patient discharged home with family. Script and discharged instructions given to patient. Patient and family stated understanding of instructions given. Patient has an appointment with Dr. Xu 

## 2022-12-19 NOTE — Progress Notes (Signed)
Physical Therapy Treatment Patient Details Name: Lori Holland MRN: [id number] DOB: [birth year] Today's Date: 12/19/2022   History of Present Illness 72 y.o. female presents to [facility] hospital on 12/18/2022 for elective L TKA. PMH includes HTN, PONV, heart murmur.    PT Comments    Pt greeted up in chair on arrival, agreeable to session and eager for mobility progression. Pt demonstrating transfers and gait at grossly supervision assist with RW for support. Pt demonstrating good recall for increased heel strike and with increasingly symmetrical stride with increased distance. Pt was educated on continued walker use to maximize functional independence, safety, and decrease risk for falls as well as appropriate activity progression, ice, safe car entry/exit and importance of continued mobility with pt verbalizing understanding. Pt verbalizing understanding of all HEP exercises and able to demonstrate back with cues for technique. Anticipate safe discharge, with assist level outlined below, once medically cleared, will continue to follow acutely.     Recommendations for follow up therapy are one component of a multi-disciplinary discharge planning process, led by the attending physician.  Recommendations may be updated based on patient status, additional functional criteria and insurance authorization.  Follow Up Recommendations       Assistance Recommended at Discharge PRN  Patient can return home with the following A little help with bathing/dressing/bathroom;Assistance with cooking/housework;Assist for transportation;Help with stairs or ramp for entrance   Equipment Recommendations      Recommendations for Other Services       Precautions / Restrictions Precautions Precautions: Fall;Knee Precaution Booklet Issued: Yes (comment) Restrictions Weight Bearing Restrictions: Yes LLE Weight Bearing: Weight bearing as tolerated     Mobility  Bed Mobility Overal bed mobility: Modified  Independent             General bed mobility comments: pt up in chair on arrival    Transfers Overall transfer level: Needs assistance Equipment used: Rolling walker (2 wheels) Transfers: Sit to/from Stand Sit to Stand: Supervision           General transfer comment: cues for hand placement on armrests on rise    Ambulation/Gait Ambulation/Gait assistance: Supervision Gait Distance (Feet): 560 Feet Assistive device: Rolling walker (2 wheels) Gait Pattern/deviations: Step-through pattern Gait velocity: reduced     General Gait Details: steady step-through gait with pt demosntrating good recall for increased heel strike on L, progressively symmetrical stride with increased distance, light cues for upright posture   Stairs             Wheelchair Mobility    Modified Rankin (Stroke Patients Only)       Balance Overall balance assessment: Needs assistance Sitting-balance support: No upper extremity supported, Feet supported Sitting balance-Leahy Scale: Good     Standing balance support: Single extremity supported, Bilateral upper extremity supported, Reliant on assistive device for balance Standing balance-Leahy Scale: Fair                              Cognition Arousal/Alertness: Awake/alert Behavior During Therapy: WFL for tasks assessed/performed Overall Cognitive Status: Within Functional Limits for tasks assessed                                          Exercises Total Joint Exercises Quad Sets: AROM, Left, 10 reps Heel Slides: AROM, Left, 10 reps Hip ABduction/ADduction: AROM, Left, 5 reps  Straight Leg Raises: AROM, Left, 5 reps Long Arc Quad: AROM, Left, 5 reps Knee Flexion: AROM, Left, 5 reps Goniometric ROM: 5-110    General Comments General comments (skin integrity, edema, etc.): VSS on RA      Pertinent Vitals/Pain Pain Assessment Pain Assessment: Faces Faces Pain Scale: Hurts a little bit Pain  Location: L knee Pain Descriptors / Indicators: Sore Pain Intervention(s): Limited activity within patient's tolerance, Monitored during session, Ice applied    Home Living                          Prior Function            PT Goals (current goals can now be found in the care plan section) Acute Rehab PT Goals Patient Stated Goal: to return to independence PT Goal Formulation: With patient Time For Goal Achievement: 12/22/22 Progress towards PT goals: Progressing toward goals    Frequency    7X/week      PT Plan      Co-evaluation              AM-PAC PT "6 Clicks" Mobility   Outcome Measure  Help needed turning from your back to your side while in a flat bed without using bedrails?: None Help needed moving from lying on your back to sitting on the side of a flat bed without using bedrails?: None Help needed moving to and from a bed to a chair (including a wheelchair)?: A Little Help needed standing up from a chair using your arms (e.g., wheelchair or bedside chair)?: A Little Help needed to walk in hospital room?: A Little Help needed climbing 3-5 steps with a railing? : A Little 6 Click Score: 20    End of Session   Activity Tolerance: Patient tolerated treatment well Patient left: in bed;with call bell/phone within reach Nurse Communication: Mobility status PT Visit Diagnosis: Other abnormalities of gait and mobility (R26.89);Muscle weakness (generalized) (M62.81)     Time: 1610-9604 PT Time Calculation (min) (ACUTE ONLY): 21 min  Charges:  $Gait Training: 8-22 mins                     Eriyah Fernando R. PTA Acute Rehabilitation Services Office: [phone]   Catalina Antigua 12/19/2022, 10:35 AM

## 2022-12-19 NOTE — Progress Notes (Signed)
Subjective: 1 Day Post-Op Procedure(s) (LRB): LEFT TOTAL KNEE ARTHROPLASTY (Left) Patient reports pain as mild.  No complaints this am.   Objective: Vital signs in last 24 hours: Temp:  [97.6 F (36.4 C)-98.1 F (36.7 C)] 97.8 F (36.6 C) (06/11 0424) Pulse Rate:  [50-84] 54 (06/11 0424) Resp:  [15-23] 18 (06/11 0424) BP: (110-161)/(56-76) 142/57 (06/11 0424) SpO2:  [91 %-100 %] 98 % (06/11 0424)  Intake/Output from previous day: 06/10 0701 - 06/11 0700 In: 3230 [P.O.:1320; I.V.:1710; IV Piggyback:200] Out: 3650 [Urine:3550; Blood:100] Intake/Output this shift: No intake/output data recorded.  No results for input(s): "HGB" in the last 72 hours. No results for input(s): "WBC", "RBC", "HCT", "PLT" in the last 72 hours. No results for input(s): "NA", "K", "CL", "CO2", "BUN", "CREATININE", "GLUCOSE", "CALCIUM" in the last 72 hours. No results for input(s): "LABPT", "INR" in the last 72 hours.  Neurologically intact Neurovascular intact Sensation intact distally Intact pulses distally Dorsiflexion/Plantar flexion intact Incision: dressing C/D/I No cellulitis present Compartment soft   Assessment/Plan: 1 Day Post-Op Procedure(s) (LRB): LEFT TOTAL KNEE ARTHROPLASTY (Left) Advance diet Up with therapy D/C IV fluids Discharge home with home health Once clears PT AND has urinated  WBAT LLE       Cristie Hem 12/19/2022, 7:55 AM

## 2022-12-20 ENCOUNTER — Telehealth: Payer: Self-pay | Admitting: Orthopaedic Surgery

## 2022-12-20 NOTE — Telephone Encounter (Signed)
Lori Holland (PT) from [facility] requesting verbal orders for home health physical therapy 2wk 1, 3 wk 1, and 1wk 1. Also requesting a 5 inch wheel walker. Pt was given standard walk but needs 5 inch wheel walker. Please call Misty Stanley on secure line at [phone].

## 2022-12-20 NOTE — Telephone Encounter (Signed)
Spoke with Misty Stanley and gave verbal okay, per Dr.Xu. Also left script up front for pick up for 5inch wheel walker. She will let patient know.

## 2022-12-26 ENCOUNTER — Telehealth: Payer: Self-pay | Admitting: Orthopaedic Surgery

## 2022-12-26 ENCOUNTER — Encounter: Payer: Self-pay | Admitting: Orthopaedic Surgery

## 2022-12-26 NOTE — Telephone Encounter (Signed)
[name] (OT) from [facility] called requesting  verbal orders for continue OT for 1 wk 4. Please call [name] back on secure line at [phone].

## 2022-12-26 NOTE — Telephone Encounter (Signed)
sure

## 2022-12-26 NOTE — Telephone Encounter (Signed)
Called and gave verbal ok

## 2022-12-27 NOTE — Telephone Encounter (Signed)
The surgery itself can cause bruising which will also be exacerbated by the fact we have her on a blood thinner.  I would just continue to ice the knee

## 2022-12-28 ENCOUNTER — Telehealth: Payer: Self-pay

## 2022-12-28 MED ORDER — OXYCODONE-ACETAMINOPHEN 5-325 MG PO TABS: 1.0000 | ORAL_TABLET | Freq: Four times a day (QID) | ORAL | 0 refills | Status: AC | PRN

## 2022-12-28 NOTE — Addendum Note (Signed)
Addended by: Mayra Reel on: 12/28/2022 11:17 AM   Modules accepted: Orders

## 2022-12-28 NOTE — Telephone Encounter (Signed)
done

## 2022-12-28 NOTE — Telephone Encounter (Signed)
Called and advised.

## 2022-12-28 NOTE — Telephone Encounter (Signed)
Patient would like a Rx refill on Oxycodone sent to her pharmacy.  CB# [phone].  Please advise.  Thank you

## 2023-01-02 ENCOUNTER — Ambulatory Visit (INDEPENDENT_AMBULATORY_CARE_PROVIDER_SITE_OTHER): Payer: Medicare Other | Admitting: Physician Assistant

## 2023-01-02 ENCOUNTER — Encounter: Payer: Self-pay | Admitting: Physician Assistant

## 2023-01-02 DIAGNOSIS — Z96652 Presence of left artificial knee joint: Secondary | ICD-10-CM

## 2023-01-02 NOTE — Progress Notes (Signed)
   Post-Op Visit Note   Patient: Lori Holland           Date of Birth: [birth year]           MRN: [id number] Visit Date: 01/02/2023 PCP: System, Provider Not In   Assessment & Plan:  Chief Complaint:  Chief Complaint  Patient presents with   Left Knee - Follow-up    Left total knee arthroplasty 12/18/2022   Visit Diagnoses:  1. Status post total left knee replacement     Plan: Patient is a pleasant 72 year old female comes in today 2 weeks status post left total knee replacement 12/18/2022.  She has been doing well.  She has been getting home health physical therapy and is ambulating with a walker.  She has been taking oxycodone and Robaxin for pain.  She is taking a baby aspirin twice daily for DVT prophylaxis.  Examination of the left knee reveals a well healing surgical incision with nylon sutures in place.  No evidence of infection or cellulitis.  Calves are soft nontender.  She is neurovascular intact distally.  Today, sutures were removed and Steri-Strips applied.  Postop instruction handout provided.  I sent in an internal referral for outpatient PT in [place] as well as a hard prescription for PT for another therapist she may be interested in.  She will continue taking her baby aspirin twice daily for another 4 weeks.  Follow-up in 4 weeks for repeat evaluation and 2 view x-rays of the left knee.  Call with concerns or questions.  Follow-Up Instructions: Return in about 4 weeks (around 01/30/2023).   Orders:  Orders Placed This Encounter  Procedures   Ambulatory referral to Physical Therapy   No orders of the defined types were placed in this encounter.   Imaging: No new imaging  PMFS History: Patient Active Problem List   Diagnosis Date Noted   Status post total left knee replacement 12/18/2022   Primary osteoarthritis of left knee 12/17/2022   Past Medical History:  Diagnosis Date   Bulging disc    Heart murmur    Hypertension    PONV (postoperative  nausea and vomiting)     No family history on file.  Past Surgical History:  Procedure Laterality Date   ABDOMINAL HYSTERECTOMY     APPENDECTOMY     CHOLECYSTECTOMY     TONSILLECTOMY     TOTAL KNEE ARTHROPLASTY Left 12/18/2022   Procedure: LEFT TOTAL KNEE ARTHROPLASTY;  Surgeon: Tarry Kos, MD;  Location: [facility] OR;  Service: Orthopedics;  Laterality: Left;   Social History   Occupational History   Not on file  Tobacco Use   Smoking status: Never   Smokeless tobacco: Never  Vaping Use   Vaping Use: Never used  Substance and Sexual Activity   Alcohol use: No   Drug use: Never   Sexual activity: Not on file

## 2023-01-08 ENCOUNTER — Other Ambulatory Visit: Payer: Self-pay | Admitting: Orthopaedic Surgery

## 2023-01-08 ENCOUNTER — Other Ambulatory Visit: Payer: Self-pay | Admitting: Physician Assistant

## 2023-01-08 MED ORDER — OXYCODONE-ACETAMINOPHEN 5-325 MG PO TABS: 1.0000 | ORAL_TABLET | Freq: Three times a day (TID) | ORAL | 0 refills | Status: AC | PRN

## 2023-01-15 ENCOUNTER — Ambulatory Visit
Admission: RE | Admit: 2023-01-15 | Discharge: 2023-01-15 | Disposition: A | Discharge status: Home / Self Care | Payer: Medicare Other | Source: Ambulatory Visit | Attending: Nurse Practitioner | Admitting: Nurse Practitioner

## 2023-01-15 DIAGNOSIS — Z1231 Encounter for screening mammogram for malignant neoplasm of breast: Secondary | ICD-10-CM

## 2023-01-24 ENCOUNTER — Other Ambulatory Visit: Payer: Self-pay | Admitting: Orthopaedic Surgery

## 2023-01-24 ENCOUNTER — Encounter: Payer: Self-pay | Admitting: Orthopaedic Surgery

## 2023-01-24 MED ORDER — TRAMADOL HCL 50 MG PO TABS: 50.0000 mg | ORAL_TABLET | Freq: Every day | ORAL | 0 refills | Status: AC | PRN

## 2023-01-26 ENCOUNTER — Encounter: Payer: Self-pay | Admitting: Orthopaedic Surgery

## 2023-01-29 ENCOUNTER — Other Ambulatory Visit: Payer: Self-pay

## 2023-01-29 MED ORDER — CELECOXIB 200 MG PO CAPS: ORAL_CAPSULE | ORAL | 6 refills | Status: AC

## 2023-01-29 NOTE — Telephone Encounter (Signed)
Approve on the celebrex.  200 mg BID.  60 tablets, 6 refills.

## 2023-02-06 ENCOUNTER — Other Ambulatory Visit (INDEPENDENT_AMBULATORY_CARE_PROVIDER_SITE_OTHER): Payer: Medicare Other

## 2023-02-06 ENCOUNTER — Encounter: Payer: Self-pay | Admitting: Orthopaedic Surgery

## 2023-02-06 ENCOUNTER — Ambulatory Visit (INDEPENDENT_AMBULATORY_CARE_PROVIDER_SITE_OTHER): Payer: Medicare Other | Admitting: Orthopaedic Surgery

## 2023-02-06 DIAGNOSIS — Z96652 Presence of left artificial knee joint: Secondary | ICD-10-CM | POA: Diagnosis not present

## 2023-02-06 MED ORDER — HYDROCODONE-ACETAMINOPHEN 5-325 MG PO TABS: 1.0000 | ORAL_TABLET | Freq: Every day | ORAL | 0 refills | Status: AC | PRN

## 2023-02-06 NOTE — Progress Notes (Signed)
   Post-Op Visit Note   Patient: Lori Holland           Date of Birth: [birth year]           MRN: [id number] Visit Date: 02/06/2023 PCP: System, Provider Not In   Assessment & Plan:  Chief Complaint:  Chief Complaint  Patient presents with   Left Knee - Routine Post Op   Visit Diagnoses:  1. Status post total left knee replacement     Plan: Lori Holland is here for 6-week postop visit status post left total knee replacement.  She reports that everything is feeling much better.  She is doing physical therapy 3 times a week.  She has noticed significant improvement.  Examination of the left knee shows a fully healed surgical scar.  She has excellent range of motion from 3 to 120 degrees.  Collaterals are stable.  No signs of infection.  From my standpoint very happy with her progress.  She can drop down to once to twice a week of PT if she wants.  Continue with home exercises.  Dental prophylaxis reinforced.  Questions encouraged and answered.  Recheck in 6 weeks.  Follow-Up Instructions: Return in about 6 weeks (around 03/20/2023) for with lindsey stanbery.   Orders:  Orders Placed This Encounter  Procedures   XR Knee 1-2 Views Left   Meds ordered this encounter  Medications   HYDROcodone-acetaminophen (NORCO) 5-325 MG tablet    Sig: Take 1-2 tablets by mouth daily as needed.    Dispense:  20 tablet    Refill:  0    Imaging: No results found.  PMFS History: Patient Active Problem List   Diagnosis Date Noted   Status post total left knee replacement 12/18/2022   Primary osteoarthritis of left knee 12/17/2022   Past Medical History:  Diagnosis Date   Bulging disc    Heart murmur    Hypertension    PONV (postoperative nausea and vomiting)     No family history on file.  Past Surgical History:  Procedure Laterality Date   ABDOMINAL HYSTERECTOMY     APPENDECTOMY     CHOLECYSTECTOMY     TONSILLECTOMY     TOTAL KNEE ARTHROPLASTY Left 12/18/2022   Procedure:  LEFT TOTAL KNEE ARTHROPLASTY;  Surgeon: Tarry Kos, MD;  Location: [facility] OR;  Service: Orthopedics;  Laterality: Left;   Social History   Occupational History   Not on file  Tobacco Use   Smoking status: Never   Smokeless tobacco: Never  Vaping Use   Vaping status: Never Used  Substance and Sexual Activity   Alcohol use: No   Drug use: Never   Sexual activity: Not on file

## 2023-02-07 ENCOUNTER — Telehealth: Payer: Self-pay

## 2023-02-07 ENCOUNTER — Other Ambulatory Visit: Payer: Self-pay | Admitting: Physician Assistant

## 2023-02-07 MED ORDER — HYDROCODONE-ACETAMINOPHEN 7.5-325 MG PO TABS: 1.0000 | ORAL_TABLET | Freq: Three times a day (TID) | ORAL | 0 refills | Status: DC | PRN

## 2023-02-07 MED ORDER — HYDROCODONE-ACETAMINOPHEN 7.5-325 MG PO TABS: 1.0000 | ORAL_TABLET | Freq: Three times a day (TID) | ORAL | 0 refills | Status: AC | PRN

## 2023-02-07 NOTE — Telephone Encounter (Signed)
Patient called into the office stating that the medication that was called int today isnt in stock she would like to know if the medication can be sent to [facility] in [place]

## 2023-02-07 NOTE — Telephone Encounter (Signed)
Sent in norco 7.5

## 2023-02-07 NOTE — Telephone Encounter (Signed)
Called and notified patient.

## 2023-02-07 NOTE — Telephone Encounter (Signed)
sent 

## 2023-02-07 NOTE — Telephone Encounter (Signed)
Does she need something more or less stronger for pain or is she having a reaction or maybe are they out of the meds?

## 2024-02-11 ENCOUNTER — Ambulatory Visit: Admitting: Orthopedic Surgery

## 2024-02-20 ENCOUNTER — Other Ambulatory Visit (INDEPENDENT_AMBULATORY_CARE_PROVIDER_SITE_OTHER): Payer: Self-pay

## 2024-02-20 ENCOUNTER — Ambulatory Visit: Admitting: Orthopedic Surgery

## 2024-02-20 DIAGNOSIS — M654 Radial styloid tenosynovitis [de Quervain]: Secondary | ICD-10-CM

## 2024-02-20 DIAGNOSIS — M25532 Pain in left wrist: Secondary | ICD-10-CM

## 2024-02-20 MED ORDER — LIDOCAINE HCL 1 % IJ SOLN
1.0000 mL | INTRAMUSCULAR | Status: AC | PRN
Start: 2024-02-20 — End: 2024-02-20
  Administered 2024-02-20 (×2): 1 mL

## 2024-02-20 MED ORDER — BETAMETHASONE SOD PHOS & ACET 6 (3-3) MG/ML IJ SUSP
6.0000 mg | INTRAMUSCULAR | Status: AC | PRN
Start: 2024-02-20 — End: 2024-02-20
  Administered 2024-02-20 (×2): 6 mg via INTRA_ARTICULAR

## 2024-02-20 NOTE — Progress Notes (Signed)
 Lori Holland - 73 y.o. female MRN [id number]  Date of birth: 07-02-1951  Office Visit Note: Visit Date: 02/20/2024 PCP: System, Provider Not In Referred by: No ref. provider found  Subjective: No chief complaint on file.  HPI: Lori Holland is a pleasant 73 y.o. female who presents today for specific hand surgical evaluation of her ongoing left thumb and radial sided wrist pain that is been present for multiple months, worsening in nature.  She did have a previous traumatic injury in June of this year, was seen in urgent care setting was diagnosed with a nondisplaced radial styloid fracture.  Was placed to a thumb spica brace at that time which she has been utilizing.  Pain is involved now to radiating up and down the thumb and distal radial forearm region, related mostly to the thumb pinch and heavy grip activities.  Other than the bracing, no other treat modalities have been tried.  Pertinent ROS were reviewed with the patient and found to be negative unless otherwise specified above in HPI.   Visit Reason: Left thumb and wrist pain Duration of symptoms: 2 months Hand dominance: right Occupation: cleaning Diabetic: No Smoking: No Heart/Lung History: heart murmur Blood Thinners: none  Prior Testing/EMG: x-ray  Injections (Date): none Treatments: splint, brace Prior Surgery: none  Assessment & Plan: Visit Diagnoses:  1. De Quervain's tenosynovitis, left   2. Pain in left wrist     Plan: Extensive discussion was had with the patient today regarding her left wrist radial sided pain.  Repeat x-rays were obtained today which show interval healing of nondisplaced radial styloid fracture.  Patient has signs and symptoms consistent with de Quervain's tenosynovitis.  We discussed the underlying etiology and pathophysiology of this condition as well as treatment modalities ranging from conservative to surgical.  From a conservative standpoint, we discussed activity  modification, anti-inflammatory medication both topical and oral, bracing, therapy and cortisone injections.  From a surgical standpoint, I explained to patient that we can perform right wrist first extensor compartment release should symptoms remain affected conservative care.  At this juncture, understanding her options, she would like to transition to a Comfort Cool brace and trial a cortisone injection to the right wrist first tensor compartment for symptom relief which is appropriate.  Risk and benefits of the injection were discussed in detail today, patient elected to proceed.  Injection was performed without incident and patient will follow-up in approximate 6 weeks for repeat examination.   Follow-up: No follow-ups on file.   Meds & Orders: No orders of the defined types were placed in this encounter.   Orders Placed This Encounter  Procedures   XR Wrist Complete Left     Procedures: Hand/UE Inj: L extensor compartment 1 for de Quervain's tenosynovitis on 02/20/2024 7:01 PM Indications: pain Details: 22 G needle Medications: 1 mL lidocaine  1 %; 6 mg betamethasone  acetate-betamethasone  sodium phosphate  6 (3-3) MG/ML Outcome: tolerated well, no immediate complications Procedure, treatment alternatives, risks and benefits explained, specific risks discussed. Consent was given by the patient.          Clinical History: No specialty comments available.  She reports that she has never smoked. She has never used smokeless tobacco. No results for input(s): HGBA1C, LABURIC in the last 8760 hours.  Objective:   Vital Signs: There were no vitals taken for this visit.  Physical Exam  Gen: Well-appearing, in no acute distress; non-toxic CV: Regular Rate. Well-perfused. Warm.  Resp: Breathing unlabored on room  air; no wheezing. Psych: Fluid speech in conversation; appropriate affect; normal thought process  Ortho Exam General: Patient is well appearing and in no distress.  Cervical spine mobility is full in all directions:  Skin and Muscle: No skin changes are apparent to upper extremities.  Muscle bulk and contour normal, no signs of atrophy.     Range of Motion and Palpation Tests: Mobility is full about the elbows with flexion and extension.  Forearm supination and pronation are 85/85 bilaterally.  Wrist flexion/extension is 75/65 bilaterally.  Digital flexion and extension are full.    No cords or nodules are palpated.  No triggering is observed.   Finklestein test is positive left side, there is significant pain over the radial styloid with associated swelling and tenderness.    Neurologic, Vascular, Motor: Sensation is intact to light touch in the median/radial/ulnar distributions.   Fingers pink and well perfused.  Capillary refill is brisk.     Imaging: XR Wrist Complete Left Result Date: 02/20/2024 X-rays demonstrate previously known nondisplaced radial styloid fracture with appropriate interval healing.  Radiocarpal joint remains well located in all planes without evidence of dislocation.   Past Medical/Family/Surgical/Social History: Medications & Allergies reviewed per EMR, new medications updated. Patient Active Problem List   Diagnosis Date Noted   Status post total left knee replacement 12/18/2022   Primary osteoarthritis of left knee 12/17/2022   Past Medical History:  Diagnosis Date   Bulging disc    Heart murmur    Hypertension    PONV (postoperative nausea and vomiting)    No family history on file. Past Surgical History:  Procedure Laterality Date   ABDOMINAL HYSTERECTOMY     APPENDECTOMY     CHOLECYSTECTOMY     TONSILLECTOMY     TOTAL KNEE ARTHROPLASTY Left 12/18/2022   Procedure: LEFT TOTAL KNEE ARTHROPLASTY;  Surgeon: Jerri Kay HERO, MD;  Location: [facility] OR;  Service: Orthopedics;  Laterality: Left;   Social History   Occupational History   Not on file  Tobacco Use   Smoking status: Never   Smokeless tobacco: Never   Vaping Use   Vaping status: Never Used  Substance and Sexual Activity   Alcohol use: No   Drug use: Never   Sexual activity: Not on file    Donivin Wirt Afton Alderton, M.D.  [facility], Hand Surgery

## 2024-03-04 ENCOUNTER — Other Ambulatory Visit: Payer: Self-pay | Admitting: Nurse Practitioner

## 2024-03-04 DIAGNOSIS — Z1231 Encounter for screening mammogram for malignant neoplasm of breast: Secondary | ICD-10-CM

## 2024-03-05 ENCOUNTER — Ambulatory Visit
Admission: RE | Admit: 2024-03-05 | Discharge: 2024-03-05 | Disposition: A | Discharge status: Home / Self Care | Source: Ambulatory Visit | Attending: Nurse Practitioner | Admitting: Nurse Practitioner

## 2024-03-05 DIAGNOSIS — Z1231 Encounter for screening mammogram for malignant neoplasm of breast: Secondary | ICD-10-CM

## 2024-04-02 ENCOUNTER — Ambulatory Visit: Admitting: Orthopedic Surgery

## 2024-05-12 ENCOUNTER — Encounter: Payer: Self-pay | Admitting: Radiology

## 2024-06-03 ENCOUNTER — Ambulatory Visit (INDEPENDENT_AMBULATORY_CARE_PROVIDER_SITE_OTHER): Admitting: Orthopedic Surgery

## 2024-06-03 DIAGNOSIS — M79642 Pain in left hand: Secondary | ICD-10-CM | POA: Diagnosis not present

## 2024-06-03 DIAGNOSIS — M654 Radial styloid tenosynovitis [de Quervain]: Secondary | ICD-10-CM

## 2024-06-03 MED ORDER — LIDOCAINE HCL 1 % IJ SOLN
1.0000 mL | INTRAMUSCULAR | Status: AC | PRN
Start: 2024-06-03 — End: 2024-06-03
  Administered 2024-06-03: 1 mL

## 2024-06-03 MED ORDER — BETAMETHASONE SOD PHOS & ACET 6 (3-3) MG/ML IJ SUSP
6.0000 mg | INTRAMUSCULAR | Status: AC | PRN
Start: 2024-06-03 — End: 2024-06-03
  Administered 2024-06-03: 6 mg via INTRA_ARTICULAR

## 2024-06-03 NOTE — Progress Notes (Signed)
 Lori Holland - 73 y.o. female MRN [id number]  Date of birth: 07-19-1950  Office Visit Note: Visit Date: 06/03/2024 PCP: System, Provider Not In Referred by: No ref. provider found  Subjective: No chief complaint on file.  HPI: Lori Holland is a pleasant 73 y.o. female who returns today for follow-up of left wrist de Quervain's tenosynovitis.  At her previous visit approximately 3 months prior, she underwent injection to the left wrist first extensor compartment with significant relief.  Symptoms have since recurred.  She is also been utilizing a Comfort Cool brace as instructed.  Pertinent ROS were reviewed with the patient and found to be negative unless otherwise specified above in HPI.    Assessment & Plan: Visit Diagnoses:  1. De Quervain's tenosynovitis, left      Plan: Extensive discussion was once again had with the patient today regarding her left wrist radial sided pain. Patient has signs and symptoms consistent with de Quervain's tenosynovitis.  We discussed the underlying etiology and pathophysiology of this condition as well as treatment modalities ranging from conservative to surgical.  From a conservative standpoint, we discussed activity modification, anti-inflammatory medication both topical and oral, bracing, therapy and cortisone injections.  From a surgical standpoint, I explained to patient that we can perform wrist first extensor compartment release should symptoms remain affected conservative care.  At this juncture, understanding her options, she would like to trial another cortisone injection given the significant relief she got with the first one. Risks and benefits of the injection were discussed in detail today, patient elected to proceed.  Injection was performed without incident and patient will follow-up in approximate 6 weeks for repeat examination.  She was also fitted for a new Comfort Cool brace today.   Follow-up: No follow-ups on  file.   Meds & Orders: No orders of the defined types were placed in this encounter.   No orders of the defined types were placed in this encounter.    Procedures: Hand/UE Inj: L extensor compartment 1 for de Quervain's tenosynovitis on 06/03/2024 9:36 AM Indications: pain Details: 25 G needle Medications: 1 mL lidocaine  1 %; 6 mg betamethasone  acetate-betamethasone  sodium phosphate  6 (3-3) MG/ML Outcome: tolerated well, no immediate complications Procedure, treatment alternatives, risks and benefits explained, specific risks discussed. Consent was given by the patient.          Clinical History: No specialty comments available.  She reports that she has never smoked. She has never used smokeless tobacco. No results for input(s): HGBA1C, LABURIC in the last 8760 hours.  Objective:   Vital Signs: There were no vitals taken for this visit.  Physical Exam  Gen: Well-appearing, in no acute distress; non-toxic CV: Regular Rate. Well-perfused. Warm.  Resp: Breathing unlabored on room air; no wheezing. Psych: Fluid speech in conversation; appropriate affect; normal thought process  Ortho Exam General: Patient is well appearing and in no distress. Cervical spine mobility is full in all directions:  Skin and Muscle: No skin changes are apparent to upper extremities.  Muscle bulk and contour normal, no signs of atrophy.     Range of Motion and Palpation Tests: Mobility is full about the elbows with flexion and extension.  Forearm supination and pronation are 85/85 bilaterally.  Wrist flexion/extension is 75/65 bilaterally.  Digital flexion and extension are full.    No cords or nodules are palpated.  No triggering is observed.   Finklestein test is positive left side, there is significant pain over the  radial styloid with associated swelling and tenderness.    Neurologic, Vascular, Motor: Sensation is intact to light touch in the median/radial/ulnar distributions.   Fingers  pink and well perfused.  Capillary refill is brisk.     Imaging: No results found.   Past Medical/Family/Surgical/Social History: Medications & Allergies reviewed per EMR, new medications updated. Patient Active Problem List   Diagnosis Date Noted   Status post total left knee replacement 12/18/2022   Primary osteoarthritis of left knee 12/17/2022   Past Medical History:  Diagnosis Date   Bulging disc    Heart murmur    Hypertension    PONV (postoperative nausea and vomiting)    No family history on file. Past Surgical History:  Procedure Laterality Date   ABDOMINAL HYSTERECTOMY     APPENDECTOMY     CHOLECYSTECTOMY     TONSILLECTOMY     TOTAL KNEE ARTHROPLASTY Left 12/18/2022   Procedure: LEFT TOTAL KNEE ARTHROPLASTY;  Surgeon: Jerri Kay HERO, MD;  Location: [facility] OR;  Service: Orthopedics;  Laterality: Left;   Social History   Occupational History   Not on file  Tobacco Use   Smoking status: Never   Smokeless tobacco: Never  Vaping Use   Vaping status: Never Used  Substance and Sexual Activity   Alcohol use: No   Drug use: Never   Sexual activity: Not on file    Lori Holland Lori Holland, M.D. [facility], Hand Surgery

## 2024-06-10 ENCOUNTER — Ambulatory Visit: Admitting: Orthopedic Surgery

## 2024-07-20 NOTE — Progress Notes (Unsigned)
 Injection follow up; 06/03/24 Left wrist

## 2024-07-21 ENCOUNTER — Ambulatory Visit: Admitting: Orthopedic Surgery

## 2024-07-21 DIAGNOSIS — M654 Radial styloid tenosynovitis [de Quervain]: Secondary | ICD-10-CM
